# Patient Record
Sex: Female | Born: 1985 | Hispanic: Yes | Marital: Single | Smoking: Never smoker
Health system: Southern US, Community
[De-identification: ages and names within clinical notes are randomized; demographics above are authoritative.]

---

## 2015-07-20 LAB — OB RESULTS CONSOLE HIV ANTIBODY (ROUTINE TESTING): HIV: NONREACTIVE

## 2015-07-20 LAB — OB RESULTS CONSOLE RPR: RPR: NONREACTIVE

## 2015-07-20 LAB — OB RESULTS CONSOLE HEPATITIS B SURFACE ANTIGEN: HEP B S AG: NEGATIVE

## 2015-07-20 LAB — OB RESULTS CONSOLE VARICELLA ZOSTER ANTIBODY, IGG: VARICELLA IGG: IMMUNE

## 2015-07-20 LAB — OB RESULTS CONSOLE RUBELLA ANTIBODY, IGM: Rubella: IMMUNE

## 2015-08-14 NOTE — L&D Delivery Note (Signed)
Delivery Note  Pt admitted in active labor. She progressed spontaneously without anesthesia to 8cm. She requested an epidural and had a good response to it. AROM for thick mec fluid 2 hours prior to delivery. At 10:21 AM a viable and healthy female (Maybe Orpah Greekaron, maybe Jose- she is still deciding) was delivered via Vaginal, Spontaneous Delivery (Presentation: ; Occiput Anterior).  APGAR: 8, 9; weight 9 lb (4082 g).   Placenta status: spontaneous, intact.  Cord: 3 vessels with the following complications: None.    Anesthesia: Epidural  Episiotomy:  none Lacerations:  1st deg Suture Repair: 2.0 vicryl rapide Est. Blood Loss (mL):  300  Mom to postpartum.  Baby to Couplet care / Skin to Skin. Plans to breastfeed and is undecided on contraception.  Christeen DouglasBEASLEY, Anhelica Fowers 01/22/2016, 10:44 AM

## 2015-12-28 LAB — OB RESULTS CONSOLE GBS: GBS: NEGATIVE

## 2016-01-21 ENCOUNTER — Inpatient Hospital Stay
Admission: EM | Admit: 2016-01-21 | Discharge: 2016-01-24 | DRG: 775 | Disposition: A | Discharge status: Home / Self Care | Payer: Medicaid Other | Attending: Obstetrics and Gynecology | Admitting: Obstetrics and Gynecology

## 2016-01-21 ENCOUNTER — Encounter: Payer: Self-pay | Admitting: Obstetrics and Gynecology

## 2016-01-21 DIAGNOSIS — O1494 Unspecified pre-eclampsia, complicating childbirth: Secondary | ICD-10-CM | POA: Diagnosis present

## 2016-01-21 DIAGNOSIS — Z3A39 39 weeks gestation of pregnancy: Secondary | ICD-10-CM | POA: Diagnosis not present

## 2016-01-21 DIAGNOSIS — Z3493 Encounter for supervision of normal pregnancy, unspecified, third trimester: Secondary | ICD-10-CM

## 2016-01-21 LAB — RAPID HIV SCREEN (HIV 1/2 AB+AG)
HIV 1/2 ANTIBODIES: NONREACTIVE
HIV-1 P24 ANTIGEN - HIV24: NONREACTIVE

## 2016-01-21 LAB — CBC
HEMATOCRIT: 37.7 % (ref 35.0–47.0)
HEMOGLOBIN: 12.8 g/dL (ref 12.0–16.0)
MCH: 26 pg (ref 26.0–34.0)
MCHC: 33.9 g/dL (ref 32.0–36.0)
MCV: 76.7 fL — AB (ref 80.0–100.0)
Platelets: 301 10*3/uL (ref 150–440)
RBC: 4.91 MIL/uL (ref 3.80–5.20)
RDW: 14.2 % (ref 11.5–14.5)
WBC: 8.5 10*3/uL (ref 3.6–11.0)

## 2016-01-21 MED ORDER — BUTORPHANOL TARTRATE 1 MG/ML IJ SOLN
1.0000 mg | INTRAMUSCULAR | Status: DC | PRN
  Administered 2016-01-22 (×3): 1 mg via INTRAVENOUS
  Filled 2016-01-21 (×3): qty 1

## 2016-01-21 MED ORDER — LIDOCAINE HCL (PF) 1 % IJ SOLN
30.0000 mL | INTRAMUSCULAR | Status: DC | PRN
Start: 2016-01-21 — End: 2016-01-22

## 2016-01-21 MED ORDER — OXYCODONE-ACETAMINOPHEN 5-325 MG PO TABS: 2.0000 | ORAL_TABLET | ORAL | Status: DC | PRN

## 2016-01-21 MED ORDER — DEXTROSE 5 % IV SOLN
5.0000 10*6.[IU] | Freq: Once | INTRAVENOUS | Status: DC
  Filled 2016-01-21: qty 5

## 2016-01-21 MED ORDER — LACTATED RINGERS IV SOLN: 500.0000 mL | INTRAVENOUS | Status: DC | PRN

## 2016-01-21 MED ORDER — ACETAMINOPHEN 325 MG PO TABS: 650.0000 mg | ORAL_TABLET | ORAL | Status: DC | PRN

## 2016-01-21 MED ORDER — OXYTOCIN BOLUS FROM INFUSION: 500.0000 mL | INTRAVENOUS | Status: DC

## 2016-01-21 MED ORDER — DEXTROSE 5 % IV SOLN
2.5000 10*6.[IU] | INTRAVENOUS | Status: DC
  Filled 2016-01-21 (×3): qty 2.5

## 2016-01-21 MED ORDER — OXYTOCIN 40 UNITS IN LACTATED RINGERS INFUSION - SIMPLE MED
2.5000 [IU]/h | INTRAVENOUS | Status: DC
  Filled 2016-01-21: qty 1000

## 2016-01-21 MED ORDER — OXYCODONE-ACETAMINOPHEN 5-325 MG PO TABS: 1.0000 | ORAL_TABLET | ORAL | Status: DC | PRN

## 2016-01-21 MED ORDER — ONDANSETRON HCL 4 MG/2ML IJ SOLN: 4.0000 mg | Freq: Four times a day (QID) | INTRAMUSCULAR | Status: DC | PRN

## 2016-01-21 MED ORDER — SOD CITRATE-CITRIC ACID 500-334 MG/5ML PO SOLN: 30.0000 mL | ORAL | Status: DC | PRN

## 2016-01-21 MED ORDER — LACTATED RINGERS IV SOLN
INTRAVENOUS | Status: DC
  Administered 2016-01-21 – 2016-01-22 (×2): via INTRAVENOUS

## 2016-01-21 NOTE — H&P (Signed)
Holly [name] is a 30 y.o. female G2P1001 at 39+3 by sure LMP of 04/20/15 presenting for contractions x18hrs. Good fetal movement, no LOF or VB.  [facility] gets [facility] at [facility]. We have minimal records on her, but she states that her pregnancy has been uncomplicated. She is O pos. Prior [place]VD in [place] at term of 9#3oz baby, unmedicated. She is hoping for no epidural with this delivery as well. She is Spanish speaking only.  Maternal Medical History:  Reason for admission: Nausea.    OB History    Gravida Para Term Preterm AB TAB SAB Ectopic Multiple Living   2 1 1  0 0 0 0 0 0 1     No past medical history on file. No past surgical history on file. Family History: family history is not on file. Social History:  has no tobacco, alcohol, and drug history on file.   Prenatal Transfer Tool  Maternal Diabetes: No  Maternal Substance Abuse:  No Significant Maternal Medications:  None Significant Maternal Lab Results:  None Other Comments:  None  Review of Systems  Constitutional: Negative for fever and chills.  Eyes: Negative for blurred vision and double vision.  Respiratory: Negative for shortness of breath.   Cardiovascular: Negative for chest pain and palpitations.  Gastrointestinal: Negative for nausea, vomiting, abdominal pain, diarrhea and constipation.  Genitourinary: Negative for dysuria, urgency, frequency and flank pain.  Neurological: Negative for headaches.  Psychiatric/Behavioral: Negative for depression.    Dilation: 6 Effacement (%): 80 Station: -1 Exam by:: Dalbert GarnetBeasley, MD Blood pressure 136/94, pulse 93, temperature 98.4 F (36.9 C), temperature source Oral, resp. rate 18, height 5' 7.32" (1.71 m), weight 207 lb (93.895 kg). Maternal Exam:  Uterine Assessment: Contraction strength is firm.  Contraction frequency is regular.   Abdomen: Patient reports no abdominal tenderness. Fetal presentation: vertex  Introitus: Normal vulva. Normal vagina.   Pelvis: adequate for delivery.   Cervix: Cervix evaluated by digital exam.     Fetal Exam Fetal Monitor Review: Mode: ultrasound.    Fetal State Assessment: Category I - tracings are normal.     Physical Exam  Constitutional: She is oriented to person, place, and time. She appears well-developed and well-nourished. No distress.  Eyes: No scleral icterus.  Neck: Normal range of motion. Neck supple.  Cardiovascular: Normal rate.   Respiratory: Effort normal. No respiratory distress.  GI: Soft. She exhibits no distension. There is no tenderness.  Genitourinary: Vagina normal and uterus normal.  Musculoskeletal: Normal range of motion.  Neurological: She is alert and oriented to person, place, and time.  Skin: Skin is warm and dry.  Psychiatric: She has a normal mood and affect.    Prenatal labs: ABO, Rh: --/--/PENDING (06/10 2155) Antibody: PENDING (06/10 2155) Rubella:   immune RPR:   non reactive HBsAg:   negative HIV:   negative GBS:   negative  ASSESSMENT AND PLAN:  Admit for active labor Labs pending Epidural when desired Continuous fetal monitoring   1. Fetal Well being  - Fetal Tracing: Cat I - Ultrasound: confirmed vertex at bedside. Prior pregnancy Level II not available in paper or electronic records. Clinic not open tonight - Group B Streptococcus: neg - Presentation: vtx confirmed by u/s   2. Routine OB: - Prenatal labs reviewed, as above - Rh + (Opos)  3. Induction of Labor:  -  Contractions external toco in place -  Pelvis proven to 9#3oz  Anticipate vaginal delivery.    Christeen DouglasBEASLEY, Regnia Mathwig  01/21/2016, 11:26 PM

## 2016-01-22 ENCOUNTER — Inpatient Hospital Stay: Payer: Medicaid Other | Admitting: Anesthesiology

## 2016-01-22 ENCOUNTER — Encounter: Payer: Self-pay | Admitting: *Deleted

## 2016-01-22 LAB — TYPE AND SCREEN
ABO/RH(D): O POS
Antibody Screen: NEGATIVE

## 2016-01-22 MED ORDER — KETOROLAC TROMETHAMINE 30 MG/ML IJ SOLN: 30.0000 mg | Freq: Four times a day (QID) | INTRAMUSCULAR | Status: AC | PRN

## 2016-01-22 MED ORDER — TETANUS-DIPHTH-ACELL PERTUSSIS 5-2.5-18.5 LF-MCG/0.5 IM SUSP: 0.5000 mL | Freq: Once | INTRAMUSCULAR | Status: DC

## 2016-01-22 MED ORDER — LIDOCAINE HCL (PF) 1 % IJ SOLN
INTRAMUSCULAR | Status: DC | PRN
  Administered 2016-01-22: 3 mL via SUBCUTANEOUS

## 2016-01-22 MED ORDER — SIMETHICONE 80 MG PO CHEW: 80.0000 mg | CHEWABLE_TABLET | ORAL | Status: DC | PRN

## 2016-01-22 MED ORDER — BENZOCAINE-MENTHOL 20-0.5 % EX AERO
1.0000 "application " | INHALATION_SPRAY | CUTANEOUS | Status: DC | PRN
  Filled 2016-01-22: qty 56

## 2016-01-22 MED ORDER — ACETAMINOPHEN 325 MG PO TABS: 650.0000 mg | ORAL_TABLET | ORAL | Status: DC | PRN

## 2016-01-22 MED ORDER — SODIUM CHLORIDE 0.9 % IV SOLN
INTRAVENOUS | Status: DC | PRN
  Administered 2016-01-22 (×2): 5 mL via EPIDURAL

## 2016-01-22 MED ORDER — DIPHENHYDRAMINE HCL 25 MG PO CAPS: 25.0000 mg | ORAL_CAPSULE | ORAL | Status: DC | PRN

## 2016-01-22 MED ORDER — MISOPROSTOL 200 MCG PO TABS
ORAL_TABLET | ORAL | Status: AC
  Filled 2016-01-22: qty 4

## 2016-01-22 MED ORDER — LIDOCAINE-EPINEPHRINE (PF) 1.5 %-1:200000 IJ SOLN
INTRAMUSCULAR | Status: DC | PRN
  Administered 2016-01-22: 3 mL via PERINEURAL

## 2016-01-22 MED ORDER — IBUPROFEN 600 MG PO TABS
600.0000 mg | ORAL_TABLET | Freq: Four times a day (QID) | ORAL | Status: DC
  Administered 2016-01-22 – 2016-01-24 (×7): 600 mg via ORAL
  Filled 2016-01-22 (×7): qty 1

## 2016-01-22 MED ORDER — NALOXONE HCL 0.4 MG/ML IJ SOLN: 0.4000 mg | INTRAMUSCULAR | Status: DC | PRN

## 2016-01-22 MED ORDER — DIPHENHYDRAMINE HCL 25 MG PO CAPS: 25.0000 mg | ORAL_CAPSULE | Freq: Four times a day (QID) | ORAL | Status: DC | PRN

## 2016-01-22 MED ORDER — NALBUPHINE HCL 10 MG/ML IJ SOLN: 5.0000 mg | INTRAMUSCULAR | Status: DC | PRN

## 2016-01-22 MED ORDER — WITCH HAZEL-GLYCERIN EX PADS: 1.0000 "application " | MEDICATED_PAD | CUTANEOUS | Status: DC | PRN

## 2016-01-22 MED ORDER — ZOLPIDEM TARTRATE 5 MG PO TABS: 5.0000 mg | ORAL_TABLET | Freq: Every evening | ORAL | Status: DC | PRN

## 2016-01-22 MED ORDER — ONDANSETRON HCL 4 MG PO TABS: 4.0000 mg | ORAL_TABLET | ORAL | Status: DC | PRN

## 2016-01-22 MED ORDER — MEPERIDINE HCL 25 MG/ML IJ SOLN: 6.2500 mg | INTRAMUSCULAR | Status: DC | PRN

## 2016-01-22 MED ORDER — SODIUM CHLORIDE 0.9% FLUSH: 3.0000 mL | INTRAVENOUS | Status: DC | PRN

## 2016-01-22 MED ORDER — DIPHENHYDRAMINE HCL 50 MG/ML IJ SOLN: 12.5000 mg | INTRAMUSCULAR | Status: DC | PRN

## 2016-01-22 MED ORDER — SODIUM CHLORIDE 0.9% FLUSH: 3.0000 mL | Freq: Two times a day (BID) | INTRAVENOUS | Status: DC

## 2016-01-22 MED ORDER — OXYTOCIN 10 UNIT/ML IJ SOLN
INTRAMUSCULAR | Status: AC
  Filled 2016-01-22: qty 2

## 2016-01-22 MED ORDER — NALBUPHINE HCL 10 MG/ML IJ SOLN: 5.0000 mg | Freq: Once | INTRAMUSCULAR | Status: DC | PRN

## 2016-01-22 MED ORDER — NALOXONE HCL 2 MG/2ML IJ SOSY
1.0000 ug/kg/h | PREFILLED_SYRINGE | INTRAMUSCULAR | Status: DC | PRN
Start: 2016-01-22 — End: 2016-01-24
  Filled 2016-01-22: qty 2

## 2016-01-22 MED ORDER — MEASLES, MUMPS & RUBELLA VAC ~~LOC~~ INJ
0.5000 mL | INJECTION | Freq: Once | SUBCUTANEOUS | Status: DC
  Filled 2016-01-22: qty 0.5

## 2016-01-22 MED ORDER — SENNOSIDES-DOCUSATE SODIUM 8.6-50 MG PO TABS
2.0000 | ORAL_TABLET | ORAL | Status: DC
  Administered 2016-01-22: 2 via ORAL
  Filled 2016-01-22: qty 2

## 2016-01-22 MED ORDER — DIBUCAINE 1 % RE OINT: 1.0000 "application " | TOPICAL_OINTMENT | RECTAL | Status: DC | PRN

## 2016-01-22 MED ORDER — FENTANYL 2.5 MCG/ML W/ROPIVACAINE 0.2% IN NS 100 ML EPIDURAL INFUSION (ARMC-ANES)
EPIDURAL | Status: AC
  Administered 2016-01-22: 10 mL/h via EPIDURAL
  Filled 2016-01-22: qty 100

## 2016-01-22 MED ORDER — ONDANSETRON HCL 4 MG/2ML IJ SOLN: 4.0000 mg | Freq: Three times a day (TID) | INTRAMUSCULAR | Status: DC | PRN

## 2016-01-22 MED ORDER — FLEET ENEMA 7-19 GM/118ML RE ENEM: 1.0000 | ENEMA | Freq: Every day | RECTAL | Status: DC | PRN

## 2016-01-22 MED ORDER — FENTANYL 2.5 MCG/ML W/ROPIVACAINE 0.2% IN NS 100 ML EPIDURAL INFUSION (ARMC-ANES): 10.0000 mL/h | EPIDURAL | Status: DC

## 2016-01-22 MED ORDER — BISACODYL 10 MG RE SUPP
10.0000 mg | Freq: Every day | RECTAL | Status: DC | PRN
  Filled 2016-01-22: qty 1

## 2016-01-22 MED ORDER — AMMONIA AROMATIC IN INHA
RESPIRATORY_TRACT | Status: AC
  Filled 2016-01-22: qty 10

## 2016-01-22 MED ORDER — PRENATAL MULTIVITAMIN CH
1.0000 | ORAL_TABLET | Freq: Every day | ORAL | Status: DC
  Administered 2016-01-22 – 2016-01-24 (×3): 1 via ORAL
  Filled 2016-01-22 (×3): qty 1

## 2016-01-22 MED ORDER — LIDOCAINE HCL (PF) 1 % IJ SOLN
INTRAMUSCULAR | Status: AC
  Filled 2016-01-22: qty 30

## 2016-01-22 MED ORDER — COCONUT OIL OIL: 1.0000 "application " | TOPICAL_OIL | Status: DC | PRN

## 2016-01-22 MED ORDER — ONDANSETRON HCL 4 MG/2ML IJ SOLN: 4.0000 mg | INTRAMUSCULAR | Status: DC | PRN

## 2016-01-22 MED ORDER — SODIUM CHLORIDE 0.9 % IV SOLN: 250.0000 mL | INTRAVENOUS | Status: DC | PRN

## 2016-01-22 MED ORDER — OXYTOCIN 40 UNITS IN LACTATED RINGERS INFUSION - SIMPLE MED
1.0000 m[IU]/min | INTRAVENOUS | Status: DC
  Administered 2016-01-22: 4 m[IU]/min via INTRAVENOUS
  Administered 2016-01-22: 2 m[IU]/min via INTRAVENOUS
  Administered 2016-01-22: 1 m[IU]/min via INTRAVENOUS
  Administered 2016-01-22: 3 m[IU]/min via INTRAVENOUS

## 2016-01-22 NOTE — Progress Notes (Signed)
Holly [name] is a 30 y.o. G2P1001 at 4745w4d by LMP admitted for active labor  Subjective: Comfortable after epidural  Objective: BP 100/60 mmHg  Pulse 98  Temp(Src) 98.2 F (36.8 C) (Oral)  Resp 18  Ht 5' 7.32" (1.71 m)  Wt 207 lb (93.895 kg)  BMI 32.11 kg/m2      FHT:  Cat I with accels, mod var, no decels UC:   regular, every 4-5 minutes SVE:   Dilation: 8 Effacement (%): 90 Station: 0 Exam by:: BB MD  Labs: Lab Results  Component Value Date   WBC 8.5 01/21/2016   HGB 12.8 01/21/2016   HCT 37.7 01/21/2016   MCV 76.7* 01/21/2016   PLT 301 01/21/2016    Assessment / Plan: Spontaneous labor, progressing normally - AROM for thick meconium fluid  Labor: Progressing normally Preeclampsia:  Elevated BP now resolved after epidural Fetal Wellbeing:  Category I Pain Control:  Epidural I/D:  n/a Anticipated MOD:  NSVD  Holly [name] 01/22/2016, 7:58 AM

## 2016-01-22 NOTE — Progress Notes (Signed)
At 2020 RN to the bedside to triage patient...r/o labor.  Pt. Is spanish speaking, with FOB and family member at the bedside who is interpretering  For her. Video Interpreter to be used.  Pt. SVE = 80%/5/-1 with membrane intact. Video Interpreter now at the bedside... Used to completed admission assessment. Pt. To be admitted after notifying doctor.  Pt. Desires natural childbirth, no epidural; but will accept IV pain medication. RN will continue to monitor.

## 2016-01-22 NOTE — Plan of Care (Signed)
Pt up to bedside commode to void. Very small amount at this time. Pericare completed well per pt. New gown. [facility]. Ellison Carwin Carra Brindley RNC

## 2016-01-22 NOTE — Anesthesia Preprocedure Evaluation (Signed)
Anesthesia Evaluation  Patient identified by MRN, date of birth, ID band Patient awake    Reviewed: Allergy & Precautions, H&P , NPO status , Patient's Chart, lab work & pertinent test results, reviewed documented beta blocker date and time   History of Anesthesia Complications Negative for: history of anesthetic complications  Airway Mallampati: II  TM Distance: >3 FB Neck ROM: full    Dental no notable dental hx. (+) Teeth Intact, Caps   Pulmonary neg pulmonary ROS,    Pulmonary exam normal breath sounds clear to auscultation       Cardiovascular Exercise Tolerance: Good negative cardio ROS Normal cardiovascular exam Rhythm:regular Rate:Normal     Neuro/Psych negative neurological ROS  negative psych ROS   GI/Hepatic Neg liver ROS, GERD  ,  Endo/Other  negative endocrine ROS  Renal/GU negative Renal ROS  negative genitourinary   Musculoskeletal   Abdominal   Peds  Hematology negative hematology ROS (+)   Anesthesia Other Findings History reviewed. No pertinent past medical history.   Reproductive/Obstetrics negative OB ROS                             Anesthesia Physical Anesthesia Plan  ASA: II  Anesthesia Plan: Epidural   Post-op Pain Management:    Induction:   Airway Management Planned:   Additional Equipment:   Intra-op Plan:   Post-operative Plan:   Informed Consent: I have reviewed the patients History and Physical, chart, labs and discussed the procedure including the risks, benefits and alternatives for the proposed anesthesia with the patient or authorized representative who has indicated his/her understanding and acceptance.   Dental Advisory Given  Plan Discussed with: Anesthesiologist, CRNA and Surgeon  Anesthesia Plan Comments:         Anesthesia Quick Evaluation

## 2016-01-22 NOTE — Progress Notes (Signed)
Holly [name] is a 30 y.o. G2P1001 at 2017w4d by LMP admitted for active labor. Spanish interpreter by iPad present for discussion of epidural  Subjective: Feeling contractions strongly and requesting epidural for pain control  Objective: BP 140/93 mmHg  Pulse 85  Temp(Src) 98.2 F (36.8 C) (Oral)  Resp 18  Ht 5' 7.32" (1.71 m)  Wt 207 lb (93.895 kg)  BMI 32.11 kg/m2      FHT:  FHR: 130 bpm, variability: moderate,  accelerations:  Present,  decelerations:  Absent UC:   regular, every 4 minutes SVE:   Dilation: 8 Effacement (%): 90 Station: -1 Exam by:: Dalbert GarnetBeasley, MD  Labs: Lab Results  Component Value Date   WBC 8.5 01/21/2016   HGB 12.8 01/21/2016   HCT 37.7 01/21/2016   MCV 76.7* 01/21/2016   PLT 301 01/21/2016    Assessment / Plan: Spontaneous labor, progressing normally  Labor: Progressing normally. Pitocin was started and reached 592mu/min to increase frequency of contractions, but turned off as patient progressing. Fetal Wellbeing:  Category I Pain Control:  Labor support without medications and IV pain meds I/D:  n/a Anticipated MOD:  NSVD  Prabhleen Montemayor 01/22/2016, 6:21 AM

## 2016-01-22 NOTE — Progress Notes (Signed)
Dr. Dalbert GarnetBeasley at the bedside to exam patient and possible AROM.  Pt. In pain, wanting pain relief.  Video Interpreter being used, pt. Desires epidural; consent signed, Dr. Karlton LemonKarenz notified. Video interpreter remains on the the line (VI) Dr. Karlton LemonKarenz arrives.

## 2016-01-22 NOTE — Progress Notes (Signed)
Rafael used for interpretation of infant teaching and also meds and moms postpartum care, and to call in patients food. Patient voiced understanding and didn't have any questions

## 2016-01-22 NOTE — Anesthesia Procedure Notes (Signed)
Epidural Patient location during procedure: OB Start time: 01/22/2016 6:58 AM End time: 01/22/2016 7:11 AM  Staffing Anesthesiologist: Lenard SimmerKARENZ, Evanthia Maund Performed by: anesthesiologist   Preanesthetic Checklist Completed: patient identified, site marked, surgical consent, pre-op evaluation, timeout performed, IV checked, risks and benefits discussed and monitors and equipment checked  Epidural Patient position: sitting Prep: ChloraPrep Patient monitoring: heart rate, continuous pulse ox and blood pressure Approach: midline Location: L3-L4 Injection technique: LOR saline  Needle:  Needle type: Tuohy  Needle gauge: 17 G Needle length: 9 cm and 9 Needle insertion depth: 5.5 cm Catheter type: closed end flexible Catheter size: 19 Gauge Catheter at skin depth: 9.5 cm Test dose: negative and 1.5% lidocaine with Epi 1:200 K  Assessment Events: blood not aspirated, injection not painful, no injection resistance, negative IV test and no paresthesia  Additional Notes   Patient tolerated the insertion well without complications.Reason for block:procedure for pain

## 2016-01-22 NOTE — Progress Notes (Signed)
Patient ID: Holly [name], female   DOB: 07/18/1986, 30 y.o.   MRN: [id number]  Elevated BP x2 while in labor. No s/s PreE. Labs wnl. No HA. Likely pain related but will recheck when comfortable.

## 2016-01-23 LAB — CBC
HCT: 33.9 % — ABNORMAL LOW (ref 35.0–47.0)
HEMOGLOBIN: 11.6 g/dL — AB (ref 12.0–16.0)
MCH: 26.4 pg (ref 26.0–34.0)
MCHC: 34.3 g/dL (ref 32.0–36.0)
MCV: 77 fL — ABNORMAL LOW (ref 80.0–100.0)
PLATELETS: 241 10*3/uL (ref 150–440)
RBC: 4.4 MIL/uL (ref 3.80–5.20)
RDW: 14.4 % (ref 11.5–14.5)
WBC: 9.6 10*3/uL (ref 3.6–11.0)

## 2016-01-23 LAB — RPR: RPR: NONREACTIVE

## 2016-01-23 MED ORDER — IBUPROFEN 600 MG PO TABS: 600.0000 mg | ORAL_TABLET | Freq: Four times a day (QID) | ORAL | Status: DC

## 2016-01-23 NOTE — Discharge Summary (Signed)
Obstetric Discharge Summary Reason for Admission: onset of labor Prenatal Procedures: ultrasound Intrapartum Procedures: spontaneous vaginal delivery Postpartum Procedures: none Complications-Operative and Postpartum: 1 degree perineal laceration HEMOGLOBIN  Date Value Ref Range Status  01/23/2016 11.6* 12.0 - 16.0 g/dL Final   HCT  Date Value Ref Range Status  01/23/2016 33.9* 35.0 - 47.0 % Final    Physical Exam:  General: alert, cooperative and no distress CB: RRR Pulm: CTAB Lochia: appropriate Uterine Fundus: firm Incision: healing well, no significant drainage, no dehiscence, no significant erythema DVT Evaluation: No evidence of DVT seen on physical exam. Negative Homan's sign.  Discharge Diagnoses: Term Pregnancy-delivered  Discharge Information: Date: 01/23/2016 Activity: pelvic rest Diet: routine Medications: Ibuprofen Condition: stable Instructions: refer to practice specific booklet Discharge to: home Follow-up Information    Follow up with Christeen DouglasBEASLEY, Larell Baney, MD In 6 weeks.   Specialty:  Obstetrics and Gynecology   Why:  For postpartum visit   Contact information:   [address] [place] [address] [phone]       Newborn Data: Live born female Elita Quick(Jose or Clifton Custardaron) Birth Weight: 9 lb (4082 g) APGAR: 8, 9  Home with mother. Pt discharged home on postpartum day #1  Christeen DouglasBEASLEY, Therisa Mennella 01/23/2016, 11:31 AM

## 2016-01-23 NOTE — Anesthesia Postprocedure Evaluation (Signed)
Anesthesia Post Note  Patient: Holly RubensMirna Martinez [name]  Procedure(s) Performed: * No procedures listed *  Patient location during evaluation: Mother Baby Anesthesia Type: Epidural Level of consciousness: awake and alert and oriented Pain management: satisfactory to patient Vital Signs Assessment: post-procedure vital signs reviewed and stable Respiratory status: respiratory function stable Cardiovascular status: stable Postop Assessment: no headache, no backache, epidural receding, patient able to bend at knees, no signs of nausea or vomiting and adequate PO intake Anesthetic complications: no    Last Vitals:  Filed Vitals:   01/22/16 2300 01/23/16 0300  BP: 127/69 101/61  Pulse: 91 80  Temp: 37 C 36.6 C  Resp: 20 20    Last Pain:  Filed Vitals:   01/23/16 0611  PainSc: [name]                 Mikai Meints D

## 2016-01-23 NOTE — Discharge Instructions (Signed)
Parto vaginal, Cuidados posteriores  °(Vaginal Delivery, Care After) °Siga estas instrucciones durante las próximas semanas. Estas indicaciones para el alta le proporcionan información general acerca de cómo deberá cuidarse después del parto. El médico también podrá darle instrucciones específicas. El tratamiento ha sido planificado según las prácticas médicas actuales, pero en algunos casos pueden ocurrir problemas. Comuníquese con el médico si tiene algún problema o tiene preguntas al volver a su casa.  °INSTRUCCIONES PARA EL CUIDADO EN EL HOGAR  °· Tome sólo medicamentos de venta libre o recetados, según las indicaciones del médico o del farmacéutico. °· No beba alcohol, especialmente si está amamantando o toma analgésicos. °· No mastique tabaco ni fume. °· No consuma drogas. °· Continúe con un adecuado cuidado perineal. El buen cuidado perineal incluye: °¨ Higienizarse de adelante hacia atrás. °¨ Mantener la zona perineal limpia. °· No use tampones ni duchas vaginales hasta que su médico la autorice. °· Dúchese, lávese el cabello y tome baños de inmersión según las indicaciones de su médico. °· Utilice un sostén que le ajuste bien y que brinde buen soporte a sus mamas. °· Consuma alimentos saludables. °· Beba suficiente líquido para mantener la orina clara o de color amarillo pálido. °· Consuma alimentos ricos en fibra como cereales y panes integrales, arroz, frijoles y frutas y verduras frescas todos los días. Estos alimentos pueden ayudarla a prevenir o aliviar el estreñimiento. °· Siga las recomendaciones de su médico relacionadas con la reanudación de actividades como subir escaleras, conducir automóviles, levantar objetos, hacer ejercicios o viajar. °· Hable con su médico acerca de reanudar la actividad sexual. Volver a la actividad sexual depende del riesgo de infección, la velocidad de la curación y la comodidad y su deseo de reanudarla. °· Trate de que alguien la ayude con las actividades del hogar y con  el recién nacido al menos durante un par de días después de salir del hospital. °· Descanse todo lo que pueda. Trate de descansar o tomar una siesta mientras el bebé está durmiendo. °· Aumente sus actividades gradualmente. °· Cumpla con todas las visitas de control programadas para después del parto. Es muy importante asistir a todas las citas programadas de seguimiento. En estas citas, su médico va a controlarla para asegurarse de que esté sanando física y emocionalmente. °SOLICITE ATENCIÓN MÉDICA SI:  °· Elimina coágulos grandes por la vagina. Guarde algunos coágulos para mostrarle al médico. °· Tiene una secreción con feo olor que proviene de la vagina. °· Tiene dificultad para orinar. °· Orina con frecuencia. °· Siente dolor al orinar. °· Nota un cambio en sus movimientos intestinales. °· Aumenta el enrojecimiento, el dolor o la hinchazón en la zona de la incisión vaginal (episiotomía) o el desgarro vaginal. °· Tiene pus que drena por la episiotomía o el desgarro vaginal. °· La episiotomía o el desgarro vaginal se abren. °· Sus mamas le duelen, están duras o enrojecidas. °· Sufre un dolor intenso de cabeza. °· Tiene visión borrosa o ve manchas. °· Se siente triste o deprimida. °· Tiene pensamientos acerca de lastimarse o dañar al recién nacido. °· Tiene preguntas acerca de su cuidado personal, el cuidado del recién nacido o acerca de los medicamentos. °· Se siente mareada o sufre un desmayo. °· Tiene una erupción. °· Tiene náuseas o vómitos. °· Usted amamantó al bebé y no ha tenido su período menstrual dentro de las 12 semanas después de dejar de amamantar. °· No amamanta al bebé y no tuvo su período menstrual en las últimas 12° semanas después del   parto. °· Tiene fiebre. °SOLICITE ATENCIÓN MÉDICA DE INMEDIATO SI:  °· Siente dolor persistente. °· Siente dolor en el pecho. °· Le falta el aire. °· Se desmaya. °· Siente dolor en la pierna. °· Siente dolor en el estómago. °· El sangrado vaginal satura dos o más  apósitos en 1 hora. °  °Esta información no tiene como fin reemplazar el consejo del médico. Asegúrese de hacerle al médico cualquier pregunta que tenga. °  °Document Released: 07/30/2005 Document Revised: 04/20/2015 °Elsevier Interactive Patient Education ©2016 [employer] Inc[employer] ° °

## 2016-01-23 NOTE — Anesthesia Post-op Follow-up Note (Signed)
  Anesthesia Pain Follow-up Note  Patient: Holly [name]  Day #: 1  Date of Follow-up: 01/23/2016 Time: 7:19 AM  Last Vitals:  Filed Vitals:   01/22/16 2300 01/23/16 0300  BP: 127/69 101/61  Pulse: 91 80  Temp: 37 C 36.6 C  Resp: 20 20    Level of Consciousness: alert  Pain: none   Side Effects:None  Catheter Site Exam: site not evaluated  Plan: [name]/C from anesthesia care  Clydene PughBeane, Holly [name]

## 2016-01-24 MED ORDER — IBUPROFEN 600 MG PO TABS: 600.0000 mg | ORAL_TABLET | Freq: Four times a day (QID) | ORAL | Status: DC

## 2016-01-24 NOTE — Progress Notes (Signed)
Patient discharged. Vital signs stable, bleeding within normal limits, uterus firm. Discharge instructions, prescriptions, and follow up appointment given to and reviewed with patient. Patient verbalized understanding, all questions answered. Patient will stay in hospital with infant who is now a pediatric patient.   Imagene ShellerMegan Nefi Musich, RN

## 2016-01-24 NOTE — Discharge Summary (Signed)
Obstetric Discharge Summary Reason for Admission: onset of labor Prenatal Procedures: none Intrapartum Procedures: spontaneous vaginal delivery Postpartum Procedures: none Complications-Operative and Postpartum: none HEMOGLOBIN  Date Value Ref Range Status  01/23/2016 11.6* 12.0 - 16.0 g/dL Final   HCT  Date Value Ref Range Status  01/23/2016 33.9* 35.0 - 47.0 % Final    Physical Exam:  General: alert and cooperative Lochia: appropriate Uterine Fundus: firm Incision: n/a DVT Evaluation: No evidence of DVT seen on physical exam.  Discharge Diagnoses: Term Pregnancy-delivered  Discharge Information: Date: 01/24/2016 Activity: pelvic rest Diet: routine Medications: Ibuprofen Condition: stable Instructions: refer to practice specific booklet Discharge to: home Follow-up Information    Follow up with Christeen DouglasBEASLEY, BETHANY, MD In 6 weeks.   Specialty:  Obstetrics and Gynecology   Why:  For postpartum visit   Contact information:   [address] [place] [address] [phone]       Follow up with [facility] In 6 weeks.   Specialty:  General Practice   Why:  postpartum care   Contact information:   [address] [place] [address] [phone]       Newborn Data: Live born female  Birth Weight: 9 lb (4082 g) APGAR: 8, 9  Home with mother.  Holly [name] 01/24/2016, 8:45 AM

## 2021-07-05 ENCOUNTER — Other Ambulatory Visit: Payer: Self-pay | Admitting: Physician Assistant

## 2021-07-05 DIAGNOSIS — Z3687 Encounter for antenatal screening for uncertain dates: Secondary | ICD-10-CM

## 2021-07-12 ENCOUNTER — Ambulatory Visit
Admission: RE | Admit: 2021-07-12 | Discharge: 2021-07-12 | Disposition: A | Discharge status: Home / Self Care | Payer: BC Managed Care – PPO | Source: Ambulatory Visit | Attending: Physician Assistant | Admitting: Physician Assistant

## 2021-07-12 DIAGNOSIS — Z3687 Encounter for antenatal screening for uncertain dates: Secondary | ICD-10-CM | POA: Insufficient documentation

## 2021-07-31 LAB — OB RESULTS CONSOLE RUBELLA ANTIBODY, IGM: Rubella: IMMUNE

## 2021-07-31 LAB — OB RESULTS CONSOLE RPR: RPR: NONREACTIVE

## 2021-07-31 LAB — OB RESULTS CONSOLE HEPATITIS B SURFACE ANTIGEN: Hepatitis B Surface Ag: NEGATIVE

## 2021-07-31 LAB — OB RESULTS CONSOLE HIV ANTIBODY (ROUTINE TESTING): HIV: NONREACTIVE

## 2021-07-31 LAB — OB RESULTS CONSOLE VARICELLA ZOSTER ANTIBODY, IGG: Varicella: IMMUNE

## 2021-07-31 LAB — HEPATITIS C ANTIBODY: HCV Ab: NEGATIVE

## 2021-08-12 ENCOUNTER — Emergency Department: Payer: BC Managed Care – PPO

## 2021-08-12 ENCOUNTER — Emergency Department
Admission: EM | Admit: 2021-08-12 | Discharge: 2021-08-12 | Disposition: A | Discharge status: Home / Self Care | Payer: BC Managed Care – PPO | Attending: Emergency Medicine | Admitting: Emergency Medicine

## 2021-08-12 DIAGNOSIS — O209 Hemorrhage in early pregnancy, unspecified: Secondary | ICD-10-CM

## 2021-08-12 DIAGNOSIS — O4692 Antepartum hemorrhage, unspecified, second trimester: Secondary | ICD-10-CM | POA: Diagnosis not present

## 2021-08-12 DIAGNOSIS — Z3A13 13 weeks gestation of pregnancy: Secondary | ICD-10-CM | POA: Insufficient documentation

## 2021-08-12 LAB — CBC WITH DIFFERENTIAL/PLATELET
Abs Immature Granulocytes: 0.07 10*3/uL (ref 0.00–0.07)
Basophils Absolute: 0 10*3/uL (ref 0.0–0.1)
Basophils Relative: 0 %
Eosinophils Absolute: 0.1 10*3/uL (ref 0.0–0.5)
Eosinophils Relative: 1 %
HCT: 40.2 % (ref 36.0–46.0)
Hemoglobin: 14.1 g/dL (ref 12.0–15.0)
Immature Granulocytes: 1 %
Lymphocytes Relative: 26 %
Lymphs Abs: 2.5 10*3/uL (ref 0.7–4.0)
MCH: 28.8 pg (ref 26.0–34.0)
MCHC: 35.1 g/dL (ref 30.0–36.0)
MCV: 82 fL (ref 80.0–100.0)
Monocytes Absolute: 0.6 10*3/uL (ref 0.1–1.0)
Monocytes Relative: 6 %
Neutro Abs: 6.4 10*3/uL (ref 1.7–7.7)
Neutrophils Relative %: 66 %
Platelets: 310 10*3/uL (ref 150–400)
RBC: 4.9 MIL/uL (ref 3.87–5.11)
RDW: 13.2 % (ref 11.5–15.5)
WBC: 9.7 10*3/uL (ref 4.0–10.5)
nRBC: 0 % (ref 0.0–0.2)

## 2021-08-12 LAB — TYPE AND SCREEN
ABO/RH(D): O POS
Antibody Screen: NEGATIVE

## 2021-08-12 LAB — COMPREHENSIVE METABOLIC PANEL
ALT: 24 U/L (ref 0–44)
AST: 19 U/L (ref 15–41)
Albumin: 3.9 g/dL (ref 3.5–5.0)
Alkaline Phosphatase: 47 U/L (ref 38–126)
Anion gap: 9 (ref 5–15)
BUN: 8 mg/dL (ref 6–20)
CO2: 20 mmol/L — ABNORMAL LOW (ref 22–32)
Calcium: 9.5 mg/dL (ref 8.9–10.3)
Chloride: 106 mmol/L (ref 98–111)
Creatinine, Ser: 0.58 mg/dL (ref 0.44–1.00)
GFR, Estimated: >60 mL/min (ref >60)
Glucose, Bld: 103 mg/dL — ABNORMAL HIGH (ref 70–99)
Potassium: 3.6 mmol/L (ref 3.5–5.1)
Sodium: 135 mmol/L (ref 135–145)
Total Bilirubin: 0.5 mg/dL (ref 0.3–1.2)
Total Protein: 7.2 g/dL (ref 6.5–8.1)

## 2021-08-12 NOTE — Discharge Instructions (Addendum)
Call and make an appointment with Jeralyn Ruths No lifting more than 5 pounds.  No intercourse until see by your OB/GYN

## 2021-08-12 NOTE — ED Notes (Signed)
Pt denies increased bleeding and denies increased back and abdominal pain [redacted] wks pregnant

## 2021-08-12 NOTE — ED Triage Notes (Signed)
Pt states that she is pregnant and that tonight she had vaginal bleeding since 2300 and is still bleeding. Pt states that there are clots and that it is a lot of blood. Pt states she is [redacted]wks pregnant.  Pt also complains of left lower back pain.  Pt has been seen by an OB/GYN

## 2021-08-12 NOTE — ED Provider Notes (Signed)
[facility] Emergency Department Provider Note  ____________________________________________   Event Date/Time   First MD Initiated Contact with Patient 08/12/21 0831     (approximate)  I have reviewed the triage vital signs and the nursing notes.   HISTORY  Chief Complaint Vaginal Bleeding Spanish interpreter via [employer]  HPI Holly [name] is a 35 y.o. female resents to the ED with complaint of vaginal bleeding that started approximately 11 PM yesterday.  Patient states that there appeared to be a lot of blood at that time however since being in the emergency department there is very little bleeding.  She reports that this happens almost every time she has intercourse and this is true of last evening.  Patient is getting her prenatal care currently at [facility] clinic.  She rates her discomfort as 7 out of 10.         History reviewed. No pertinent past medical history.  Patient Active Problem List   Diagnosis Date Noted   Supervision of normal pregnancy in third trimester 01/21/2016    History reviewed. No pertinent surgical history.  Prior to Admission medications   Medication Sig Start Date End Date Taking? Authorizing Provider  ibuprofen (ADVIL,MOTRIN) 600 MG tablet Take 1 tablet (600 mg total) by mouth every 6 (six) hours. 01/24/16   Schermerhorn, Ihor Austin, MD    Allergies Patient has no known allergies.  History reviewed. No pertinent family history.  Social History Social History   Tobacco Use   Smoking status: Never  Substance Use Topics   Alcohol use: No   Drug use: No    Review of Systems Constitutional: No fever/chills Eyes: No visual changes. ENT: No sore throat. Cardiovascular: Denies chest pain. Respiratory: Denies shortness of breath. Gastrointestinal: No abdominal pain.  No nausea, no vomiting.  No diarrhea.   Genitourinary: Negative for dysuria.  Positive for vaginal bleeding.  Positive  pregnancy. Musculoskeletal: Positive low back discomfort. Skin: Negative for rash. Neurological: Negative for headaches, focal weakness or numbness.  ____________________________________________   PHYSICAL EXAM:  VITAL SIGNS: ED Triage Vitals  Enc Vitals Group     BP 08/12/21 0053 111/67     Pulse Rate 08/12/21 0053 (!) 104     Resp 08/12/21 0053 18     Temp 08/12/21 0053 98.1 F (36.7 C)     Temp Source 08/12/21 0053 Oral     SpO2 08/12/21 0053 98 %     Weight --      Height --      Head Circumference --      Peak Flow --      Pain Score 08/12/21 0056 7     Pain Loc --      Pain Edu? --      Excl. in GC? --     Constitutional: Alert and oriented. Well appearing and in no acute distress. Eyes: Conjunctivae are normal.  Head: Atraumatic. Neck: No stridor.   Cardiovascular: Normal rate, regular rhythm. Grossly normal heart sounds.  Good peripheral circulation. Respiratory: Normal respiratory effort.  No retractions. Lungs CTAB. Gastrointestinal: Soft and nontender. No distention.  No CVA tenderness. Musculoskeletal: Moves upper and lower extremities with any difficulty normal gait was noted. Neurologic:  Normal speech and language. No gross focal neurologic deficits are appreciated. No gait instability. Skin:  Skin is warm, dry and intact. No rash noted. Psychiatric: Mood and affect are normal. Speech and behavior are normal.  ____________________________________________   LABS (all labs ordered are listed,  but only abnormal results are displayed)  Labs Reviewed  COMPREHENSIVE METABOLIC PANEL - Abnormal; Notable for the following components:      Result Value   CO2 20 (*)    Glucose, Bld 103 (*)    All other components within normal limits  CBC WITH DIFFERENTIAL/PLATELET  POC URINE PREG, ED  TYPE AND SCREEN   ____________________________________________ ___________________________________________  RADIOLOGY Beaulah Corin, personally viewed and  evaluated these images (plain radiographs) as part of my medical decision making, as well as reviewing the written report by the radiologist.    Official radiology report(s): US OB Comp Less 14 Wks  Result Date: 08/12/2021 CLINICAL DATA:  Pregnant, vaginal bleeding. EXAM: OBSTETRIC <14 WK ULTRASOUND TECHNIQUE: Transabdominal ultrasound was performed for evaluation of the gestation as well as the maternal uterus and adnexal regions. COMPARISON:  07/12/2021 FINDINGS: Intrauterine gestational sac: Present, single Yolk sac:  Not visualized Embryo:  Present, single Cardiac Activity: Present, regular Heart Rate: 166 bpm MSD: Appropriate given fetal size CRL:   7.0 mm   13 w 2 d [place] EDC: 02/17/2022 (based on prior examination) Subchorionic hemorrhage:  None visualized. Maternal uterus/adnexae: The uterus is anteverted. No intrauterine masses are seen. The cervix is not optimally visualized but is unremarkable. No free intraperitoneal fluid. The maternal ovaries are not visualized on this examination. IMPRESSION: Single living intrauterine gestation with appropriate interval growth since prior examination. No acute abnormality. Electronically Signed   By: Helyn Numbers M.D.   On: 08/12/2021 02:44    ____________________________________________   PROCEDURES  Procedure(s) performed (including Critical Care):  Procedures   ____________________________________________   INITIAL IMPRESSION / ASSESSMENT AND PLAN / ED COURSE  As part of my medical decision making, I reviewed the following data within the electronic MEDICAL RECORD NUMBER Notes from prior ED visits and [place] Controlled Substance Database  35 year old female presents to the ED with complaint of vaginal bleeding while being proximately [redacted] weeks pregnant.  Patient states that she has noticed that each time she has intercourse that she has vaginal bleeding.  She admits that last evening prior to her bleeding she also had intercourse.  Currently there  is minimal bleeding.  Patient was reassured that her ultrasound did show that she is 13 weeks 2 days and that the baby's heart rate was 166.  No subchorionic hemorrhage was not seen.  Patient was given precautions to abstain from intercourse until she is seen by her OB and also to avoid lifting, pushing or pulling more than 5 pounds.  Patient was given return precautions over the holiday weekend but encouraged to follow-up with her OB doctor as needed.   ____________________________________________   FINAL CLINICAL IMPRESSION(S) / ED DIAGNOSES  Final diagnoses:  Second trimester bleeding     ED Discharge Orders     None        Note:  This document was prepared using Dragon voice recognition software and may include unintentional dictation errors.    Tommi Rumps, PA-C 08/12/21 1004    Minna Antis, MD 08/12/21 [phone]

## 2021-08-13 NOTE — L&D Delivery Note (Signed)
Delivery Note  Date of delivery: 02/14/2022 Estimated Date of Delivery: 02/17/22 No LMP recorded. Patient is pregnant. EGA: [redacted]w[redacted]d  Delivery Note At 7:38 PM a viable female was delivered via Vaginal, Spontaneous. (Presentation: OA).  APGAR: 8, 9; weight pending.  Placenta status: Spontaneous, Intact.  Cord: 3 vessels with the following complications: Long.  Cord pH: n/a  First Stage: Labor onset: unknown Augmentation : pitocin and AROM Analgesia /Anesthesia intrapartum: Epidural AROM at [employer] presented to L&D with active labor. She was augmented with pitocin. Epidural placed for pain relief.   Second Stage: Complete dilation at 1938 Onset of pushing at 1938 FHR second stage Cat I Delivery at 1938 on 02/14/2022  She progressed to complete and had a spontaneous vaginal birth of a live female over an intact perineum. The fetal head was delivered in OA position with restitution to ROA. No nuchal cord. Anterior then posterior shoulders delivered spontaneously. Baby placed on mom's abdomen and attended to by transition RN. Cord clamped and cut when after a couple of minutes by the patient. Cord blood obtained for newborn labs.  Third Stage: Placenta delivered intact with 3VC at 1950 Placenta disposition: pathology for meconium Uterine tone firm / bleeding min IV pitocin given for hemorrhage prophylaxis  Anesthesia: Epidural Episiotomy: None Lacerations: None Suture Repair: n/a Est. Blood Loss (mL):  100  Complications: none  Mom to postpartum.  Baby to Couplet care / Skin to Skin.  Newborn: Birth Weight: pending  Apgar Scores: 8, 9 Feeding planned: Breast and bottle   Cyril Mourning, CNM 02/14/2022 8:02 PM

## 2022-01-18 ENCOUNTER — Other Ambulatory Visit: Payer: Self-pay | Admitting: Family Medicine

## 2022-01-18 DIAGNOSIS — Z3480 Encounter for supervision of other normal pregnancy, unspecified trimester: Secondary | ICD-10-CM

## 2022-01-26 LAB — OB RESULTS CONSOLE GBS: GBS: NEGATIVE

## 2022-01-26 LAB — OB RESULTS CONSOLE GC/CHLAMYDIA
Chlamydia: NEGATIVE
Neisseria Gonorrhea: NEGATIVE

## 2022-02-08 ENCOUNTER — Observation Stay
Admission: EM | Admit: 2022-02-08 | Discharge: 2022-02-08 | Disposition: A | Discharge status: Home / Self Care | Payer: BC Managed Care – PPO | Attending: Obstetrics and Gynecology | Admitting: Obstetrics and Gynecology

## 2022-02-08 DIAGNOSIS — O09523 Supervision of elderly multigravida, third trimester: Secondary | ICD-10-CM | POA: Insufficient documentation

## 2022-02-08 DIAGNOSIS — O358XX0 Maternal care for other (suspected) fetal abnormality and damage, not applicable or unspecified: Principal | ICD-10-CM | POA: Insufficient documentation

## 2022-02-08 DIAGNOSIS — O288 Other abnormal findings on antenatal screening of mother: Secondary | ICD-10-CM | POA: Diagnosis present

## 2022-02-08 DIAGNOSIS — Z3A38 38 weeks gestation of pregnancy: Secondary | ICD-10-CM | POA: Diagnosis not present

## 2022-02-08 NOTE — OB Triage Note (Signed)
Patient sent from [facility] for non reactive NST

## 2022-02-08 NOTE — Discharge Summary (Addendum)
Holly [name] is a 36 y.o. female. She is at [redacted]w[redacted]d gestation. No LMP recorded. Patient is pregnant. Estimated Date of Delivery: 02/17/22    Prenatal care site: [facility]    Chief Complaint: high risk pregnancy, need for antepartum surveillance  Holly [name] presents to L&D today for repeat NST d/t nonreactive NST in office.    S: Resting comfortably. no CTX, no VB.no LOF,  Active fetal movement.    Maternal Medical History:  Past Medical Hx:  has no past medical history on file.  Past Surgical Hx:  has no past surgical history on file.   No Known Allergies  Prior to Admission medications   Medication Sig Start Date End Date Taking? Authorizing Provider  ibuprofen (ADVIL,MOTRIN) 600 MG tablet Take 1 tablet (600 mg total) by mouth every 6 (six) hours. 01/24/16   Schermerhorn, Ihor Austin, MD     Social History: She  reports that she has never smoked. She does not have any smokeless tobacco history on file. She reports that she does not drink alcohol and does not use drugs.  Family History: family history non-contributory, no history of gyn cancers  Review of Systems: A full review of systems was performed and negative except as noted in the HPI.     O:  BP 129/75 (BP Location: Left Arm)   Pulse 87   Temp 98.6 F (37 C) (Oral)   Resp 20   Ht 5' 6.93" (1.7 m)   Wt (!) 136.5 kg   BMI 47.24 kg/m  No results found for this or any previous visit (from the past 48 hour(s)).   Constitutional: NAD, AAOx3  HE/ENT: extraocular movements grossly intact, moist mucous membranes CV: RRR PULM: nl respiratory effort, CTABL     Abd: gravid, non-tender, non-distended, soft      Ext: Non-tender, Nonedmeatous   Psych: mood appropriate, speech normal Pelvic: deferred   NST: Baseline: 155 Variability: moderate Accelerations present x >2 Decelerations absent Time   A/P: 36 y.o. [redacted]w[redacted]d with high risk pregnancy and antepartum surveillance.  Labor: not present.  Fetal  Wellbeing: Reassuring Cat 1 tracing. NST reviewed, reactive NST  D/c home stable, precautions reviewed, follow-up as scheduled.   ----- Margaretmary Eddy, CNM Certified Nurse Midwife [facility]

## 2022-02-14 ENCOUNTER — Other Ambulatory Visit: Payer: Self-pay

## 2022-02-14 ENCOUNTER — Encounter: Payer: Self-pay | Admitting: Obstetrics and Gynecology

## 2022-02-14 ENCOUNTER — Inpatient Hospital Stay
Admission: EM | Admit: 2022-02-14 | Discharge: 2022-02-16 | DRG: 797 | Disposition: A | Discharge status: Home / Self Care | Payer: BC Managed Care – PPO | Attending: Obstetrics | Admitting: Obstetrics

## 2022-02-14 ENCOUNTER — Inpatient Hospital Stay: Payer: BC Managed Care – PPO | Admitting: Anesthesiology

## 2022-02-14 DIAGNOSIS — Z3A39 39 weeks gestation of pregnancy: Secondary | ICD-10-CM

## 2022-02-14 DIAGNOSIS — O9081 Anemia of the puerperium: Secondary | ICD-10-CM | POA: Diagnosis not present

## 2022-02-14 DIAGNOSIS — Z302 Encounter for sterilization: Secondary | ICD-10-CM | POA: Diagnosis not present

## 2022-02-14 DIAGNOSIS — D62 Acute posthemorrhagic anemia: Secondary | ICD-10-CM | POA: Diagnosis not present

## 2022-02-14 DIAGNOSIS — O99214 Obesity complicating childbirth: Secondary | ICD-10-CM | POA: Diagnosis present

## 2022-02-14 DIAGNOSIS — O26893 Other specified pregnancy related conditions, third trimester: Secondary | ICD-10-CM | POA: Diagnosis present

## 2022-02-14 LAB — CBC
HCT: 37.9 % (ref 36.0–46.0)
Hemoglobin: 12.6 g/dL (ref 12.0–15.0)
MCH: 25.8 pg — ABNORMAL LOW (ref 26.0–34.0)
MCHC: 33.2 g/dL (ref 30.0–36.0)
MCV: 77.7 fL — ABNORMAL LOW (ref 80.0–100.0)
Platelets: 318 10*3/uL (ref 150–400)
RBC: 4.88 MIL/uL (ref 3.87–5.11)
RDW: 14.1 % (ref 11.5–15.5)
WBC: 8.5 10*3/uL (ref 4.0–10.5)
nRBC: 0 % (ref 0.0–0.2)

## 2022-02-14 LAB — TYPE AND SCREEN
ABO/RH(D): O POS
Antibody Screen: NEGATIVE

## 2022-02-14 LAB — COMPREHENSIVE METABOLIC PANEL
ALT: 20 U/L (ref 0–44)
AST: 23 U/L (ref 15–41)
Albumin: 2.8 g/dL — ABNORMAL LOW (ref 3.5–5.0)
Alkaline Phosphatase: 157 U/L — ABNORMAL HIGH (ref 38–126)
Anion gap: 9 (ref 5–15)
BUN: 7 mg/dL (ref 6–20)
CO2: 18 mmol/L — ABNORMAL LOW (ref 22–32)
Calcium: 8.4 mg/dL — ABNORMAL LOW (ref 8.9–10.3)
Chloride: 107 mmol/L (ref 98–111)
Creatinine, Ser: 0.47 mg/dL (ref 0.44–1.00)
GFR, Estimated: >60 mL/min (ref >60)
Glucose, Bld: 90 mg/dL (ref 70–99)
Potassium: 3.8 mmol/L (ref 3.5–5.1)
Sodium: 134 mmol/L — ABNORMAL LOW (ref 135–145)
Total Bilirubin: 0.5 mg/dL (ref 0.3–1.2)
Total Protein: 6.4 g/dL — ABNORMAL LOW (ref 6.5–8.1)

## 2022-02-14 LAB — PROTEIN / CREATININE RATIO, URINE
Creatinine, Urine: 153 mg/dL
Protein Creatinine Ratio: 0.41 mg/mg{Cre} — ABNORMAL HIGH (ref 0.00–0.15)
Total Protein, Urine: 62 mg/dL

## 2022-02-14 LAB — RAPID HIV SCREEN (HIV 1/2 AB+AG)
HIV 1/2 Antibodies: NONREACTIVE
HIV-1 P24 Antigen - HIV24: NONREACTIVE

## 2022-02-14 MED ORDER — EPHEDRINE 5 MG/ML INJ: 10.0000 mg | INTRAVENOUS | Status: DC | PRN

## 2022-02-14 MED ORDER — AMMONIA AROMATIC IN INHA
RESPIRATORY_TRACT | Status: AC
  Filled 2022-02-14: qty 10

## 2022-02-14 MED ORDER — FERROUS SULFATE 325 (65 FE) MG PO TABS
325.0000 mg | ORAL_TABLET | Freq: Two times a day (BID) | ORAL | Status: DC
  Administered 2022-02-15 – 2022-02-16 (×2): 325 mg via ORAL
  Filled 2022-02-14 (×2): qty 1

## 2022-02-14 MED ORDER — TERBUTALINE SULFATE 1 MG/ML IJ SOLN: 0.2500 mg | Freq: Once | INTRAMUSCULAR | Status: DC | PRN

## 2022-02-14 MED ORDER — COCONUT OIL OIL: 1.0000 | TOPICAL_OIL | Status: DC | PRN

## 2022-02-14 MED ORDER — BUPIVACAINE HCL (PF) 0.25 % IJ SOLN
INTRAMUSCULAR | Status: DC | PRN
  Administered 2022-02-14 (×2): 4 mL via EPIDURAL

## 2022-02-14 MED ORDER — OXYTOCIN-SODIUM CHLORIDE 30-0.9 UT/500ML-% IV SOLN
2.5000 [IU]/h | INTRAVENOUS | Status: DC
  Administered 2022-02-14: 2.5 [IU]/h via INTRAVENOUS
  Filled 2022-02-14: qty 500

## 2022-02-14 MED ORDER — FENTANYL-BUPIVACAINE-NACL 0.5-0.125-0.9 MG/250ML-% EP SOLN
12.0000 mL/h | EPIDURAL | Status: DC | PRN
  Administered 2022-02-14: 12 mL/h via EPIDURAL
  Filled 2022-02-14: qty 250

## 2022-02-14 MED ORDER — SODIUM CHLORIDE 0.9 % IV SOLN: 5.0000 10*6.[IU] | Freq: Once | INTRAVENOUS | Status: DC

## 2022-02-14 MED ORDER — LIDOCAINE HCL (PF) 1 % IJ SOLN
INTRAMUSCULAR | Status: AC
  Filled 2022-02-14: qty 30

## 2022-02-14 MED ORDER — PRENATAL MULTIVITAMIN CH
1.0000 | ORAL_TABLET | Freq: Every day | ORAL | Status: DC
  Administered 2022-02-16: 1 via ORAL
  Filled 2022-02-14: qty 1

## 2022-02-14 MED ORDER — LIDOCAINE HCL (PF) 1 % IJ SOLN: 30.0000 mL | INTRAMUSCULAR | Status: DC | PRN

## 2022-02-14 MED ORDER — LACTATED RINGERS IV SOLN: INTRAVENOUS | Status: DC

## 2022-02-14 MED ORDER — DIPHENHYDRAMINE HCL 25 MG PO CAPS: 25.0000 mg | ORAL_CAPSULE | Freq: Four times a day (QID) | ORAL | Status: DC | PRN

## 2022-02-14 MED ORDER — PENICILLIN G POT IN DEXTROSE 60000 UNIT/ML IV SOLN: 3.0000 10*6.[IU] | INTRAVENOUS | Status: DC

## 2022-02-14 MED ORDER — DIBUCAINE (PERIANAL) 1 % EX OINT
1.0000 | TOPICAL_OINTMENT | CUTANEOUS | Status: DC | PRN
  Administered 2022-02-14: 1 via RECTAL
  Filled 2022-02-14: qty 28

## 2022-02-14 MED ORDER — ONDANSETRON HCL 4 MG PO TABS: 4.0000 mg | ORAL_TABLET | ORAL | Status: DC | PRN

## 2022-02-14 MED ORDER — FENTANYL CITRATE (PF) 100 MCG/2ML IJ SOLN
50.0000 ug | INTRAMUSCULAR | Status: DC | PRN
  Administered 2022-02-14: 50 ug via INTRAVENOUS
  Filled 2022-02-14: qty 2

## 2022-02-14 MED ORDER — WITCH HAZEL-GLYCERIN EX PADS
MEDICATED_PAD | CUTANEOUS | Status: AC
  Administered 2022-02-14: 1 via TOPICAL
  Filled 2022-02-14: qty 100

## 2022-02-14 MED ORDER — OXYTOCIN BOLUS FROM INFUSION
333.0000 mL | Freq: Once | INTRAVENOUS | Status: AC
  Administered 2022-02-14: 333 mL via INTRAVENOUS

## 2022-02-14 MED ORDER — PHENYLEPHRINE 80 MCG/ML (10ML) SYRINGE FOR IV PUSH (FOR BLOOD PRESSURE SUPPORT): 80.0000 ug | PREFILLED_SYRINGE | INTRAVENOUS | Status: DC | PRN

## 2022-02-14 MED ORDER — ONDANSETRON HCL 4 MG/2ML IJ SOLN: 4.0000 mg | Freq: Four times a day (QID) | INTRAMUSCULAR | Status: DC | PRN

## 2022-02-14 MED ORDER — TETANUS-DIPHTH-ACELL PERTUSSIS 5-2.5-18.5 LF-MCG/0.5 IM SUSY
0.5000 mL | PREFILLED_SYRINGE | Freq: Once | INTRAMUSCULAR | Status: DC
  Filled 2022-02-14: qty 0.5

## 2022-02-14 MED ORDER — SIMETHICONE 80 MG PO CHEW: 80.0000 mg | CHEWABLE_TABLET | ORAL | Status: DC | PRN

## 2022-02-14 MED ORDER — MISOPROSTOL 200 MCG PO TABS
ORAL_TABLET | ORAL | Status: AC
  Filled 2022-02-14: qty 4

## 2022-02-14 MED ORDER — LACTATED RINGERS IV SOLN
500.0000 mL | Freq: Once | INTRAVENOUS | Status: AC
  Administered 2022-02-14: 500 mL via INTRAVENOUS

## 2022-02-14 MED ORDER — BENZOCAINE-MENTHOL 20-0.5 % EX AERO
1.0000 | INHALATION_SPRAY | CUTANEOUS | Status: DC | PRN
  Administered 2022-02-14: 1 via TOPICAL
  Filled 2022-02-14: qty 56

## 2022-02-14 MED ORDER — SOD CITRATE-CITRIC ACID 500-334 MG/5ML PO SOLN: 30.0000 mL | ORAL | Status: DC | PRN

## 2022-02-14 MED ORDER — IBUPROFEN 600 MG PO TABS
600.0000 mg | ORAL_TABLET | Freq: Four times a day (QID) | ORAL | Status: DC
  Administered 2022-02-14 – 2022-02-16 (×5): 600 mg via ORAL
  Filled 2022-02-14 (×6): qty 1

## 2022-02-14 MED ORDER — LIDOCAINE-EPINEPHRINE (PF) 1.5 %-1:200000 IJ SOLN
INTRAMUSCULAR | Status: DC | PRN
  Administered 2022-02-14: 3 mL via EPIDURAL

## 2022-02-14 MED ORDER — ACETAMINOPHEN 325 MG PO TABS
650.0000 mg | ORAL_TABLET | ORAL | Status: DC | PRN
  Administered 2022-02-14 – 2022-02-16 (×3): 650 mg via ORAL
  Filled 2022-02-14 (×3): qty 2

## 2022-02-14 MED ORDER — WITCH HAZEL-GLYCERIN EX PADS: 1.0000 | MEDICATED_PAD | CUTANEOUS | Status: DC | PRN

## 2022-02-14 MED ORDER — LACTATED RINGERS IV SOLN: 500.0000 mL | INTRAVENOUS | Status: DC | PRN

## 2022-02-14 MED ORDER — DOCUSATE SODIUM 100 MG PO CAPS
100.0000 mg | ORAL_CAPSULE | Freq: Two times a day (BID) | ORAL | Status: DC
  Administered 2022-02-15 – 2022-02-16 (×2): 100 mg via ORAL
  Filled 2022-02-14 (×2): qty 1

## 2022-02-14 MED ORDER — OXYCODONE HCL 5 MG PO TABS: 10.0000 mg | ORAL_TABLET | ORAL | Status: DC | PRN

## 2022-02-14 MED ORDER — ONDANSETRON HCL 4 MG/2ML IJ SOLN: 4.0000 mg | INTRAMUSCULAR | Status: DC | PRN

## 2022-02-14 MED ORDER — OXYTOCIN-SODIUM CHLORIDE 30-0.9 UT/500ML-% IV SOLN
1.0000 m[IU]/min | INTRAVENOUS | Status: DC
  Administered 2022-02-14: 2 m[IU]/min via INTRAVENOUS

## 2022-02-14 MED ORDER — OXYCODONE HCL 5 MG PO TABS: 5.0000 mg | ORAL_TABLET | ORAL | Status: DC | PRN

## 2022-02-14 MED ORDER — DIPHENHYDRAMINE HCL 50 MG/ML IJ SOLN: 12.5000 mg | INTRAMUSCULAR | Status: DC | PRN

## 2022-02-14 MED ORDER — ACETAMINOPHEN 500 MG PO TABS: 1000.0000 mg | ORAL_TABLET | Freq: Four times a day (QID) | ORAL | Status: DC | PRN

## 2022-02-14 MED ORDER — OXYTOCIN 10 UNIT/ML IJ SOLN
INTRAMUSCULAR | Status: AC
  Filled 2022-02-14: qty 2

## 2022-02-14 MED ORDER — LIDOCAINE HCL (PF) 1 % IJ SOLN
INTRAMUSCULAR | Status: DC | PRN
  Administered 2022-02-14: 3 mL via SUBCUTANEOUS

## 2022-02-14 NOTE — H&P (Signed)
OB History & Physical   History of Present Illness:  Chief Complaint:   HPI:  Holly [name] is a 36 y.o. G65P2002 female at [redacted]w[redacted]d dated by an 8 week u/s.  She presents to L&D for active labor.   She reports:  -active fetal movement -no leakage of fluid -bloody show present on admission -contractions currently every 2-4 minutes  Pregnancy Issues: 1. Elevated BPs started around 35 weeks 2. EFW 84% on 6/14   Maternal Medical History:  No past medical history on file.  No past surgical history on file.  No Known Allergies  Prior to Admission medications   Not on File     Prenatal care site: [facility]  Social History: She  reports that she has never smoked. She does not have any smokeless tobacco history on file. She reports that she does not drink alcohol and does not use drugs.  Family History: family history is not on file.   Review of Systems: A full review of systems was performed and negative except as noted in the HPI.    Physical Exam:  Vital Signs: BP (!) 142/73   Pulse 66   Temp 98.1 F (36.7 C) (Oral)   Ht 5\' 6"  (1.676 m)   Wt (!) 136.5 kg   BMI 48.58 kg/m   General:   alert and cooperative  Skin:  normal  Neurologic:    Alert & oriented x 3  Lungs:    Nl effort  Heart:   regular rate and rhythm  Abdomen:  soft, non-tender; bowel sounds normal; no masses,  no organomegaly  Extremities: : non-tender, symmetric, no edema bilaterally.      EFW: 3565g = 84%  Results for orders placed or performed during the hospital encounter of 02/14/22 (from the past 24 hour(s))  CBC     Status: Abnormal   Collection Time: 02/14/22  8:11 AM  Result Value Ref Range   WBC 8.5 4.0 - 10.5 K/uL   RBC 4.88 3.87 - 5.11 MIL/uL   Hemoglobin 12.6 12.0 - 15.0 g/dL   HCT 04/17/22 65.7 - 84.6 %   MCV 77.7 (L) 80.0 - 100.0 fL   MCH 25.8 (L) 26.0 - 34.0 pg   MCHC 33.2 30.0 - 36.0 g/dL   RDW 96.2 95.2 - 84.1 %   Platelets 318 150 - 400 K/uL   nRBC 0.0 0.0 - 0.2 %   Type and screen [facility]     Status: None (Preliminary result)   Collection Time: 02/14/22  8:11 AM  Result Value Ref Range   ABO/RH(D) PENDING    Antibody Screen PENDING    Sample Expiration      02/17/2022,2359 Performed at [facility], [address] [place], [place]     Pertinent Results:  Prenatal Labs: Blood type/Rh O pos  Antibody screen Neg  Rubella Immune  Varicella Immune  RPR NR  HBsAg NR  HIV NR  GC Neg  Chlamydia Neg  Genetic screening   1 hour GTT 136  3 hour GTT 85, 173, 113, 81  GBS Neg   FHT: FHR: 140 bpm, variability: moderate,  accelerations:  Present,  decelerations:  Absent Category/reactivity:  Category I TOCO: regular, every 2-4 minutes SVE: Dilation: 4.5 / Effacement (%): 80, 90 / Station: -3     Assessment:  Holly [name] is a 36 y.o. G51P2002 female at [redacted]w[redacted]d with active labor.   Plan:  1. Admit to Labor & Delivery;  consents reviewed and obtained  2. Fetal Well being  - Fetal Tracing: cat I - GBS neg - Presentation: vtx confirmed by sve   3. Routine OB: - Prenatal labs reviewed, as above - Rh pos - CBC & T&S on admit - Clear fluids, IVF  4. Monitoring of Labor -  Contractions by external toco in place -  Pelvis proven to 9lb3oz -  Plan for continuous fetal monitoring  -  Maternal pain control as desired: IVPM, nitrous, regional anesthesia - Anticipate vaginal delivery  5. Post Partum Planning: - Infant feeding: Breast - Contraception: BTL  Haroldine Laws, CNM 02/14/2022 9:18 AM

## 2022-02-14 NOTE — Anesthesia Procedure Notes (Addendum)
Epidural Patient location during procedure: OB Start time: 02/14/2022 9:37 AM End time: 02/14/2022 9:45 AM  Staffing Anesthesiologist: Lenard Simmer, MD Resident/CRNA: Irving Burton, CRNA Performed: anesthesiologist   Preanesthetic Checklist Completed: patient identified, IV checked, site marked, risks and benefits discussed, surgical consent, monitors and equipment checked, pre-op evaluation and timeout performed  Epidural Patient position: sitting Prep: ChloraPrep Patient monitoring: heart rate, continuous pulse ox and blood pressure Approach: midline Location: L3-L4 Injection technique: LOR air  Needle:  Needle type: Tuohy  Needle gauge: 17 G Needle length: 9 cm and 9 Needle insertion depth: 7 cm Catheter type: closed end flexible Catheter size: 19 Gauge Catheter at skin depth: 12 cm Test dose: negative and 1.5% lidocaine with Epi 1:200 K  Assessment Events: blood not aspirated, injection not painful, no injection resistance, no paresthesia and negative IV test  Additional Notes 1 attempt Pt. Evaluated and documentation done after procedure finished. Patient identified. Risks/Benefits/Options discussed with patient including but not limited to bleeding, infection, nerve damage, paralysis, failed block, incomplete pain control, headache, blood pressure changes, nausea, vomiting, reactions to medication both or allergic, itching and postpartum back pain. Confirmed with bedside nurse the patient's most recent platelet count. Confirmed with patient that they are not currently taking any anticoagulation, have any bleeding history or any family history of bleeding disorders. Patient expressed understanding and wished to proceed. All questions were answered. Sterile technique was used throughout the entire procedure. Please see nursing notes for vital signs. Test dose was given through epidural catheter and negative prior to continuing to dose epidural or start infusion. Warning  signs of high block given to the patient including shortness of breath, tingling/numbness in hands, complete motor block, or any concerning symptoms with instructions to call for help. Patient was given instructions on fall risk and not to get out of bed. All questions and concerns addressed with instructions to call with any issues or inadequate analgesia.    Patient tolerated the insertion well without immediate complications.Reason for block:procedure for pain

## 2022-02-14 NOTE — Anesthesia Preprocedure Evaluation (Signed)
Anesthesia Evaluation  Patient identified by MRN, date of birth, ID band Patient awake    Reviewed: Allergy & Precautions, H&P , NPO status , Patient's Chart, lab work & pertinent test results  Airway Mallampati: III  TM Distance: >3 FB Neck ROM: full    Dental  (+) Chipped   Pulmonary    Pulmonary exam normal        Cardiovascular      Neuro/Psych    GI/Hepatic GERD  ,  Endo/Other  Morbid obesity  Renal/GU      Musculoskeletal   Abdominal   Peds  Hematology negative hematology ROS (+)   Anesthesia Other Findings   Reproductive/Obstetrics (+) Pregnancy                             Anesthesia Physical Anesthesia Plan  ASA: 3  Anesthesia Plan: Epidural   Post-op Pain Management:    Induction:   PONV Risk Score and Plan:   Airway Management Planned:   Additional Equipment:   Intra-op Plan:   Post-operative Plan:   Informed Consent: I have reviewed the patients History and Physical, chart, labs and discussed the procedure including the risks, benefits and alternatives for the proposed anesthesia with the patient or authorized representative who has indicated his/her understanding and acceptance.       Plan Discussed with: Anesthesiologist and CRNA  Anesthesia Plan Comments:         Anesthesia Quick Evaluation

## 2022-02-14 NOTE — OB Triage Note (Signed)
Pt arrived to unit for contractions. Patient denies LOF or active vaginal bleeding. Pt placed on EFM and TOCO to non tender area of abdomen. Pt difficult to monitor due to obesity and BMI. Patient history reviewed. [employer] interpreter line used (See flowsheets) Will notify provider of patient's arrival.

## 2022-02-14 NOTE — Progress Notes (Signed)
Labor Progress Note  Holly [name] is a 36 y.o. G3P2002 at [redacted]w[redacted]d by ultrasound admitted for active labor  Subjective: Pt is now comfortable with epidural.  Objective: BP (!) 142/73   Pulse 66   Temp 98.1 F (36.7 C) (Oral)   Ht 5\' 6"  (1.676 m)   Wt (!) 136.5 kg   BMI 48.58 kg/m    Fetal Assessment: FHT:  FHR: 140 bpm, variability: moderate,  accelerations:  Present,  decelerations:  Absent Category/reactivity:  Category I UC:   regular, every 2-4 minutes SVE:    Dilation: 7cm  Effacement: 100%  Station:  -1  Consistency: soft  Position: anterior  Membrane status: AROM'd Amniotic color: Moderate MSAF  Labs: Lab Results  Component Value Date   WBC 8.5 02/14/2022   HGB 12.6 02/14/2022   HCT 37.9 02/14/2022   MCV 77.7 (L) 02/14/2022   PLT 318 02/14/2022    Assessment / Plan: Spontaneous labor, progressing normally AROM'd with MSAF  Labor: Progressing normally Preeclampsia:   142/73, labs ordered Fetal Wellbeing:  Category I Pain Control:  Epidural I/D:   Afebrile, GBS neg, AROM x 0 hours Anticipated MOD:  NSVD  Jenifer E Anala Whisenant, CNM 02/14/2022, 10:23 AM

## 2022-02-14 NOTE — OB Triage Note (Signed)
Margaretmary Eddy, CNM at nurses station and notified of patient's arrival. Verbal orders given to triage patient to r/o labor and SVE recheck in 1 hour. Will notify the patient on plan of care.

## 2022-02-14 NOTE — Progress Notes (Signed)
Labor Progress Note  Holly [name] is a 36 y.o. G3P2002 at [redacted]w[redacted]d by ultrasound admitted for active labor  Subjective: Pt denies pressure or feeling contractions  Objective: BP 137/75   Pulse 84   Temp 98.4 F (36.9 C)   Resp 19   Ht 5\' 6"  (1.676 m)   Wt (!) 136.5 kg   SpO2 100%   BMI 48.58 kg/m    Fetal Assessment: FHT:  FHR: 135 bpm, variability: moderate,  accelerations:  Present,  decelerations:  Present earlies Category/reactivity:  Category I UC:   regular, every 2-5 minutes SVE:    Dilation: 7cm  Effacement: 100%  Station:  -1  Consistency: soft  Position: anterior  Membrane status: AROM'd at 1016 Amniotic color: Moderate MSAF  Labs: Lab Results  Component Value Date   WBC 8.5 02/14/2022   HGB 12.6 02/14/2022   HCT 37.9 02/14/2022   MCV 77.7 (L) 02/14/2022   PLT 318 02/14/2022    Assessment / Plan: Spontaneous labor, slow cervical change, starting pitocin AROM'd with MSAF  Labor:  Augmented labor Preeclampsia:   labs pending Vitals:   02/14/22 0938 02/14/22 0951 02/14/22 0956 02/14/22 1000  BP: (!) 149/98 (!) 155/79 (!) 149/66 137/69   02/14/22 1001 02/14/22 1006 02/14/22 1011 02/14/22 1021  BP: 137/69 123/75 138/84 (!) 147/82   02/14/22 1055 02/14/22 1101 02/14/22 1149 02/14/22 1301  BP: 138/74 (!) 149/82 (!) 147/80 137/75    Fetal Wellbeing:  Category I Pain Control:  Epidural I/D:   Afebrile, GBS neg, AROM x 3 hours Anticipated MOD:  NSVD  Jenifer E Demaurion Dicioccio, CNM 02/14/2022, 1:11 PM

## 2022-02-14 NOTE — Discharge Summary (Signed)
Obstetrical Discharge Summary  Patient Name: Holly [name] DOB: 1985-09-23 MRN: [id number]  Date of Admission: 02/14/2022 Date of Delivery: 02/14/22 Delivered by: Annamary Rummage CNM Date of Discharge: 02/16/2022  Primary OB: [facility]  LMP:No LMP recorded. EDC Estimated Date of Delivery: 02/17/22 Gestational Age at Delivery: [redacted]w[redacted]d   Antepartum complications:  1. Elevated BPs started around 35 weeks 2. EFW 84% on 6/14  Admitting Diagnosis: Active labor  Secondary Diagnosis: Patient Active Problem List   Diagnosis Date Noted   NSVD (normal spontaneous vaginal delivery) 02/14/2022   Supervision of normal pregnancy in third trimester 01/21/2016    Augmentation: AROM and Pitocin Complications: None  Intrapartum complications/course:  Delivery Type: spontaneous vaginal delivery Anesthesia: epidural Placenta: spontaneous Laceration: none Episiotomy: none Newborn Data: Live born female  Birth Weight:  8#8 APGAR: 8, 9  Newborn Delivery   Birth date/time: 02/14/2022 19:38:00 Delivery type: Vaginal, Spontaneous      36 y.o. [id number] at [redacted]w[redacted]d presenting with active labor, AROM with meconium stained fluid.  She progressed to complete and pushed over an intact perineum and delivered the fetal head, followed promptly by the shoulders. She was in control the whole time, and the baby placed on the maternal abdomen. Delayed cord clamping and the patient cut the baby's cord, while the baby was skin to skin. The placenta delivered spontaneously and intact. No lacerations noted. Mom and baby tolerated the procedure well.   Postpartum Procedures: PP BTL done laparoscopy on 7/6  Post partum course:  Patient had an uncomplicated postpartum course.  By time of discharge on PPD#2, her pain was controlled on oral pain medications; she had appropriate lochia and was ambulating, voiding without difficulty and tolerating regular diet.  She was deemed stable for discharge to home.    Discharge Physical  Exam:  BP 111/67 (BP Location: Right Arm)   Pulse 88   Temp 97.8 F (36.6 C) (Oral)   Resp 20   Ht 5\' 6"  (1.676 m)   Wt (!) 136.5 kg   SpO2 99%   Breastfeeding Unknown   BMI 48.57 kg/m   General: alert and no distress Pulm: normal respiratory effort Lochia: appropriate Abdomen: soft, NT Uterine Fundus: firm, below umbilicus Periumbilical Incision: c/d/i, healing well, no significant drainage, no dehiscence, no significant erythema Extremities: No evidence of DVT seen on physical exam. No lower extremity edema. Edinburgh:     02/15/2022    6:31 AM  04/18/2022 Postnatal Depression Scale Screening Tool  I have been able to laugh and see the funny side of things. 1  I have looked forward with enjoyment to things. 0  I have blamed myself unnecessarily when things went wrong. 0  I have been anxious or worried for no good reason. 1  I have felt scared or panicky for no good reason. 0  Things have been getting on top of me. 1  I have been so unhappy that I have had difficulty sleeping. 0  I have felt sad or miserable. 0  I have been so unhappy that I have been crying. 0  The thought of harming myself has occurred to me. 0  Edinburgh Postnatal Depression Scale Total 3     Labs:    Latest Ref Rng & Units 02/15/2022    4:22 AM 02/14/2022    8:11 AM 08/12/2021    1:03 AM  CBC  WBC 4.0 - 10.5 K/uL 12.3  8.5  9.7   Hemoglobin 12.0 - 15.0 g/dL 08/14/2021  15.1  76.1  Hematocrit 36.0 - 46.0 % 37.8  37.9  40.2   Platelets 150 - 400 K/uL 295  318  310    O POS Hemoglobin  Date Value Ref Range Status  02/15/2022 12.6 12.0 - 15.0 g/dL Final   HCT  Date Value Ref Range Status  02/15/2022 37.8 36.0 - 46.0 % Final    Disposition: stable, discharge to home Baby Feeding: breastmilk and formula Baby Disposition: home with mom  Contraception: BTL, laparoscopic  bilateral cautery done 02/15/22  Prenatal Labs:  Blood type/Rh O pos  Antibody screen Neg  Rubella Immune  Varicella Immune   RPR NR  HBsAg NR  HIV NR  GC Neg  Chlamydia Neg  Genetic screening    1 hour GTT 136  3 hour GTT 85, 173, 113, 81  GBS Neg   Rh Immune globulin given: n/a Rubella vaccine given: Immune Varicella vaccine given: Immune Tdap vaccine given in AP or PP setting: unknown Flu vaccine given in AP or PP setting: not in season  Plan: Liridona Tabytha Gradillas was discharged to home in good condition. Follow-up appointment with delivering provider in 6 weeks.  Discharge Instructions: Per After Visit Summary. Activity: Advance as tolerated. Pelvic rest for 6 weeks.   Diet: Regular Discharge Medications: Allergies as of 02/16/2022   No Known Allergies      Medication List     TAKE these medications    acetaminophen 325 MG tablet Commonly known as: Tylenol Take 2 tablets (650 mg total) by mouth every 4 (four) hours as needed (for pain scale < 4).   benzocaine-Menthol 20-0.5 % Aero Commonly known as: DERMOPLAST Apply 1 Application topically as needed for irritation (perineal discomfort).   coconut oil Oil Apply 1 Application topically as needed.   diphenhydrAMINE 25 mg capsule Commonly known as: BENADRYL Take 1 capsule (25 mg total) by mouth every 6 (six) hours as needed for itching.   docusate sodium 100 MG capsule Commonly known as: COLACE Take 1 capsule (100 mg total) by mouth 2 (two) times daily.   ibuprofen 600 MG tablet Commonly known as: ADVIL Take 1 tablet (600 mg total) by mouth every 6 (six) hours.   NIFEdipine 30 MG 24 hr tablet Commonly known as: ADALAT CC Take 1 tablet (30 mg total) by mouth daily.   oxyCODONE 5 MG immediate release tablet Commonly known as: Oxy IR/ROXICODONE Take 1 tablet (5 mg total) by mouth every 6 (six) hours as needed for up to 7 days (pain scale 4-7).   simethicone 80 MG chewable tablet Commonly known as: MYLICON Chew 1 tablet (80 mg total) by mouth as needed for flatulence.   witch hazel-glycerin pad Commonly known as:  TUCKS Apply 1 Application topically as needed for hemorrhoids.       Outpatient follow up:   Follow-up Information     [facility]. Schedule an appointment as soon as possible for a visit in 6 week(s).   Contact information: [address] [place] [address]        [facility] Follow up.   Why: Call with any questions or concerns Contact information: [address] [place] [address] [phone]                Signed: Randa Ngo, CNM  02/16/2022 8:57 AM

## 2022-02-15 ENCOUNTER — Encounter
Admission: EM | Disposition: A | Discharge status: Home / Self Care | Payer: Self-pay | Source: Home / Self Care | Attending: Obstetrics

## 2022-02-15 ENCOUNTER — Inpatient Hospital Stay: Payer: BC Managed Care – PPO | Admitting: Anesthesiology

## 2022-02-15 ENCOUNTER — Encounter: Payer: Self-pay | Admitting: Obstetrics and Gynecology

## 2022-02-15 ENCOUNTER — Other Ambulatory Visit: Payer: Self-pay

## 2022-02-15 HISTORY — PX: TUBAL LIGATION: SHX77

## 2022-02-15 LAB — CBC
HCT: 37.8 % (ref 36.0–46.0)
Hemoglobin: 12.6 g/dL (ref 12.0–15.0)
MCH: 26.1 pg (ref 26.0–34.0)
MCHC: 33.3 g/dL (ref 30.0–36.0)
MCV: 78.4 fL — ABNORMAL LOW (ref 80.0–100.0)
Platelets: 295 10*3/uL (ref 150–400)
RBC: 4.82 MIL/uL (ref 3.87–5.11)
RDW: 14.2 % (ref 11.5–15.5)
WBC: 12.3 10*3/uL — ABNORMAL HIGH (ref 4.0–10.5)
nRBC: 0 % (ref 0.0–0.2)

## 2022-02-15 LAB — RPR: RPR Ser Ql: NONREACTIVE

## 2022-02-15 SURGERY — LIGATION, FALLOPIAN TUBE, POSTPARTUM
Anesthesia: General | Laterality: Bilateral

## 2022-02-15 MED ORDER — OXYCODONE HCL 5 MG PO TABS: 5.0000 mg | ORAL_TABLET | ORAL | Status: DC | PRN

## 2022-02-15 MED ORDER — NIFEDIPINE ER OSMOTIC RELEASE 30 MG PO TB24
30.0000 mg | ORAL_TABLET | Freq: Every day | ORAL | Status: DC
  Administered 2022-02-15 – 2022-02-16 (×2): 30 mg via ORAL
  Filled 2022-02-15 (×2): qty 1

## 2022-02-15 MED ORDER — ROCURONIUM BROMIDE 100 MG/10ML IV SOLN
INTRAVENOUS | Status: DC | PRN
  Administered 2022-02-15 (×2): 50 mg via INTRAVENOUS

## 2022-02-15 MED ORDER — METOCLOPRAMIDE HCL 10 MG PO TABS
ORAL_TABLET | ORAL | Status: AC
  Filled 2022-02-15: qty 1

## 2022-02-15 MED ORDER — FAMOTIDINE 20 MG PO TABS
40.0000 mg | ORAL_TABLET | Freq: Once | ORAL | Status: AC
  Administered 2022-02-15: 40 mg via ORAL
  Filled 2022-02-15: qty 2

## 2022-02-15 MED ORDER — METOCLOPRAMIDE HCL 10 MG PO TABS
10.0000 mg | ORAL_TABLET | Freq: Once | ORAL | Status: AC
  Administered 2022-02-15: 10 mg via ORAL
  Filled 2022-02-15: qty 1

## 2022-02-15 MED ORDER — LACTATED RINGERS IV SOLN: INTRAVENOUS | Status: DC

## 2022-02-15 MED ORDER — PHENYLEPHRINE HCL (PRESSORS) 10 MG/ML IV SOLN
INTRAVENOUS | Status: AC
  Filled 2022-02-15: qty 1

## 2022-02-15 MED ORDER — DEXAMETHASONE SODIUM PHOSPHATE 10 MG/ML IJ SOLN
INTRAMUSCULAR | Status: DC | PRN
  Administered 2022-02-15: 10 mg via INTRAVENOUS

## 2022-02-15 MED ORDER — FENTANYL CITRATE (PF) 100 MCG/2ML IJ SOLN
INTRAMUSCULAR | Status: DC | PRN
  Administered 2022-02-15 (×4): 50 ug via INTRAVENOUS

## 2022-02-15 MED ORDER — LIDOCAINE HCL (CARDIAC) PF 100 MG/5ML IV SOSY
PREFILLED_SYRINGE | INTRAVENOUS | Status: DC | PRN
  Administered 2022-02-15: 100 mg via INTRAVENOUS

## 2022-02-15 MED ORDER — PROPOFOL 10 MG/ML IV BOLUS
INTRAVENOUS | Status: AC
  Filled 2022-02-15: qty 20

## 2022-02-15 MED ORDER — BUPIVACAINE HCL 0.5 % IJ SOLN
INTRAMUSCULAR | Status: DC | PRN
  Administered 2022-02-15: 10 mL

## 2022-02-15 MED ORDER — FENTANYL CITRATE (PF) 100 MCG/2ML IJ SOLN
INTRAMUSCULAR | Status: AC
  Filled 2022-02-15: qty 2

## 2022-02-15 MED ORDER — SUGAMMADEX SODIUM 500 MG/5ML IV SOLN
INTRAVENOUS | Status: DC | PRN
  Administered 2022-02-15: 500 mg via INTRAVENOUS

## 2022-02-15 MED ORDER — PHENYLEPHRINE HCL (PRESSORS) 10 MG/ML IV SOLN
INTRAVENOUS | Status: DC | PRN
  Administered 2022-02-15: 80 ug via INTRAVENOUS

## 2022-02-15 MED ORDER — ONDANSETRON HCL 4 MG/2ML IJ SOLN
INTRAMUSCULAR | Status: DC | PRN
  Administered 2022-02-15: 4 mg via INTRAVENOUS

## 2022-02-15 MED ORDER — BUPIVACAINE HCL (PF) 0.5 % IJ SOLN
INTRAMUSCULAR | Status: AC
  Filled 2022-02-15: qty 30

## 2022-02-15 MED ORDER — PROPOFOL 10 MG/ML IV BOLUS
INTRAVENOUS | Status: DC | PRN
  Administered 2022-02-15: 160 mg via INTRAVENOUS

## 2022-02-15 MED ORDER — 0.9 % SODIUM CHLORIDE (POUR BTL) OPTIME
TOPICAL | Status: DC | PRN
  Administered 2022-02-15: 150 mL

## 2022-02-15 MED ORDER — FAMOTIDINE 20 MG PO TABS
ORAL_TABLET | ORAL | Status: AC
  Filled 2022-02-15: qty 2

## 2022-02-15 SURGICAL SUPPLY — 36 items
BACTOSHIELD CHG 4% 4OZ (MISCELLANEOUS) ×1
BLADE SURG SZ11 CARB STEEL (BLADE) ×2 IMPLANT
CHLORAPREP W/TINT 26 (MISCELLANEOUS) ×2 IMPLANT
DEFOGGER ANTIFOG KIT (MISCELLANEOUS) ×1 IMPLANT
DEFOGGER SCOPE WARMER CLEARIFY (MISCELLANEOUS) ×1 IMPLANT
DERMABOND ADVANCED (GAUZE/BANDAGES/DRESSINGS) ×1
DERMABOND ADVANCED .7 DNX12 (GAUZE/BANDAGES/DRESSINGS) ×1 IMPLANT
DRAPE LAPAROTOMY 77X122 PED (DRAPES) ×2 IMPLANT
DRSG TEGADERM 2-3/8X2-3/4 SM (GAUZE/BANDAGES/DRESSINGS) ×3 IMPLANT
ELECT CAUTERY BLADE 6.4 (BLADE) ×2 IMPLANT
ELECT REM PT RETURN 9FT ADLT (ELECTROSURGICAL) ×2
ELECTRODE REM PT RTRN 9FT ADLT (ELECTROSURGICAL) ×1 IMPLANT
GLOVE SURG SYN 8.0 (GLOVE) ×2 IMPLANT
GLOVE SURG SYN 8.0 PF PI (GLOVE) ×1 IMPLANT
GOWN STRL REUS W/ TWL LRG LVL3 (GOWN DISPOSABLE) ×1 IMPLANT
GOWN STRL REUS W/ TWL XL LVL3 (GOWN DISPOSABLE) ×1 IMPLANT
GOWN STRL REUS W/TWL LRG LVL3 (GOWN DISPOSABLE) ×1
GOWN STRL REUS W/TWL XL LVL3 (GOWN DISPOSABLE) ×1
KIT TURNOVER KIT A (KITS) ×2 IMPLANT
LABEL OR SOLS (LABEL) ×2 IMPLANT
MANIFOLD NEPTUNE II (INSTRUMENTS) ×2 IMPLANT
NEEDLE HYPO 22GX1.5 SAFETY (NEEDLE) ×2 IMPLANT
NS IRRIG 500ML POUR BTL (IV SOLUTION) ×2 IMPLANT
PACK BASIN MINOR ARMC (MISCELLANEOUS) ×2 IMPLANT
SCRUB CHG 4% DYNA-HEX 4OZ (MISCELLANEOUS) ×1 IMPLANT
SET TUBE SMOKE EVAC HIGH FLOW (TUBING) ×1 IMPLANT
SPONGE GAUZE 2X2 8PLY STRL LF (GAUZE/BANDAGES/DRESSINGS) ×2 IMPLANT
SUT PLAIN GUT 0 (SUTURE) ×4 IMPLANT
SUT VIC AB 2-0 UR6 27 (SUTURE) ×2 IMPLANT
SUT VIC AB 4-0 SH 27 (SUTURE) ×2
SUT VIC AB 4-0 SH 27XANBCTRL (SUTURE) ×1 IMPLANT
SUT VICRYL 0 AB UR-6 (SUTURE) ×2 IMPLANT
SYR 10ML LL (SYRINGE) ×2 IMPLANT
TROCAR XCEL BLUNT TIP 100MML (ENDOMECHANICALS) ×1 IMPLANT
TROCAR XCEL NON-BLD 5MMX100MML (ENDOMECHANICALS) ×1 IMPLANT
WATER STERILE IRR 500ML POUR (IV SOLUTION) ×2 IMPLANT

## 2022-02-15 NOTE — Brief Op Note (Signed)
02/15/2022  6:34 PM  PATIENT:  Holly [name]  36 y.o. female  PRE-OPERATIVE DIAGNOSIS:  desires sterilization  POST-OPERATIVE DIAGNOSIS:  desires sterilization  PROCEDURE:  postpartum tubal ligation converted to laparoscopic bilateral tubal cautery  SURGEON:  Surgeon(s) and Role:    * Valentina Alcoser, Ihor Austin, MD - Primary  PHYSICIAN ASSISTANT: CST  ASSISTANTS: none   ANESTHESIA:   general  EBL:  minimal   BLOOD ADMINISTERED:none  DRAINS: none   LOCAL MEDICATIONS USED:  MARCAINE     SPECIMEN:  No Specimen  DISPOSITION OF SPECIMEN:  N/A  COUNTS:  YES  TOURNIQUET:  * No tourniquets in log *  DICTATION: .Other Dictation: Dictation Number verbal  PLAN OF CARE: Admit to inpatient   PATIENT DISPOSITION:  PACU - hemodynamically stable.   Delay start of Pharmacological VTE agent (>24hrs) due to surgical blood loss or risk of bleeding: not applicable

## 2022-02-15 NOTE — Transfer of Care (Signed)
Immediate Anesthesia Transfer of Care Note  Patient: Holly [name]  Procedure(s) Performed: POST PARTUM TUBAL LIGATION, CONVERTED TO LAPAROSCOPIC BILATERAL TUBAL CARTILAGE LIGATION (Bilateral)  Patient Location: PACU  Anesthesia Type:General  Level of Consciousness: sedated  Airway & Oxygen Therapy: Patient Spontanous Breathing and Patient connected to face mask oxygen  Post-op Assessment: Report given to RN and Post -op Vital signs reviewed and stable  Post vital signs: Reviewed and stable  Last Vitals:  Vitals Value Taken Time  BP 148/93 02/15/22 1845  Temp 36.6 C 02/15/22 1846  Pulse 93 02/15/22 1848  Resp 21 02/15/22 1848  SpO2 99 % 02/15/22 1848  Vitals shown include unvalidated device data.  Last Pain:  Vitals:   02/15/22 1846  TempSrc:   PainSc: 0-No pain         Complications:  Encounter Notable Events  Notable Event Outcome Phase Comment  Difficult to intubate - unexpected  Intraprocedure Filed from anesthesia note documentation.

## 2022-02-15 NOTE — Progress Notes (Signed)
Patient ID: Holly [name], female   DOB: 05-21-1986, 36 y.o.   MRN: [id number] Pt elects for sterilization . She reconfirms today .internet translator present for review of theprocedure and risks of the procedure . No abd or pelvic surgery . No STI . Failure rate 1:300 .

## 2022-02-15 NOTE — Anesthesia Postprocedure Evaluation (Signed)
Anesthesia Post Note  Patient: [name]  Procedure(s) Performed: POST PARTUM TUBAL LIGATION, CONVERTED TO LAPAROSCOPIC BILATERAL TUBAL CARTILAGE LIGATION (Bilateral)  Patient location during evaluation: PACU Anesthesia Type: General Level of consciousness: awake and alert Pain management: pain level controlled Vital Signs Assessment: post-procedure vital signs reviewed and stable Respiratory status: spontaneous breathing, nonlabored ventilation, respiratory function stable and patient connected to nasal cannula oxygen Cardiovascular status: blood pressure returned to baseline and stable Postop Assessment: no apparent nausea or vomiting Anesthetic complications: yes   Encounter Notable Events  Notable Event Outcome Phase Comment  Difficult to intubate - unexpected  Intraprocedure Filed from anesthesia note documentation.     Last Vitals:  Vitals:   02/15/22 1900 02/15/22 1915  BP: (!) 147/96 (!) 155/89  Pulse: 80 89  Resp: (!) 21 (!) 21  Temp:  (!) 36.1 C  SpO2: 97% 98%    Last Pain:  Vitals:   02/15/22 1915  TempSrc:   PainSc: 0-No pain                 [name]

## 2022-02-15 NOTE — Lactation Note (Signed)
This note was copied from a baby's chart. Lactation Consultation Note  Patient Name: Holly [name] [id number] Date: 02/15/2022 Reason for consult: Initial assessment;Term Age:35 hours  Maternal Data This is mom's 3rd baby, born NSVD. Mom with no history of prenatal complications.She is an experienced breastfeeding mother. Mom reports she breastfed previous children but her plan for this baby is combination feeding both breastfeeding and formula supplementation. Her plan is different for this baby as she is planning to return to work and she feels a combination plan would work best. She does not have any pump at home and would like a manual pump.  Has patient been taught Hand Expression?: No Does the patient have breastfeeding experience prior to this delivery?: Yes How long did the patient breastfeed?: 2-2.5 years with her previous 2 children. Her last child is 6 years ols.  Feeding Mother's Current Feeding Choice: Breast Milk and Formula (Mom reports she wants to combination feed as she plans to return to work. She did not work with her previous 2 children.) Nipple Type: Slow - flow  Lactation Tools Discussed/Used Tools: Pump Breast pump type: Manual (Mom did not have a pump at home. She is returning to work. Provided manual pump.) Pump Education: Milk Storage;Setup, frequency, and cleaning  Interventions Interventions: Breast feeding basics reviewed;Education (Mom breastfed previous children for 2-2.5 years .Mom reports she knows how to breastfeed, doesnt need assistance at this time. Provided LC number on board if she does need any assistance.)  Discharge Discharge Education: Warning signs for feeding baby;Engorgement and breast care (Provided LC contact information for [facility] if mom has additional questions about returning to work while breastfeeding.) Pump: Manual (Mom can also inquire if she can obtain electric pump through her [employer].)  All dialogue with parents  with assistance of Spanish Interpreter(Amanda). Consult Status Consult Status: PRN  Update provided to care nurse.  Fuller Song 02/15/2022, 11:48 AM

## 2022-02-15 NOTE — Anesthesia Preprocedure Evaluation (Addendum)
Anesthesia Evaluation  Patient identified by MRN, date of birth, ID band Patient awake    Reviewed: Allergy & Precautions, NPO status , Patient's Chart, lab work & pertinent test results  History of Anesthesia Complications Negative for: history of anesthetic complications  Airway Mallampati: III  TM Distance: >3 FB Neck ROM: full    Dental  (+) Chipped, Loose   Pulmonary neg pulmonary ROS, neg shortness of breath,    Pulmonary exam normal        Cardiovascular (-) Past MI negative cardio ROS Normal cardiovascular exam     Neuro/Psych negative neurological ROS  negative psych ROS   GI/Hepatic negative GI ROS, Neg liver ROS, neg GERD  ,  Endo/Other  negative endocrine ROS  Renal/GU      Musculoskeletal   Abdominal   Peds  Hematology negative hematology ROS (+)   Anesthesia Other Findings History reviewed. No pertinent past medical history.  History reviewed. No pertinent surgical history.  BMI    Body Mass Index: 48.57 kg/m      Reproductive/Obstetrics negative OB ROS                            Anesthesia Physical Anesthesia Plan  ASA: 3  Anesthesia Plan: General ETT   Post-op Pain Management:    Induction: Intravenous  PONV Risk Score and Plan: Ondansetron, Dexamethasone, Midazolam and Treatment may vary due to age or medical condition  Airway Management Planned: Oral ETT  Additional Equipment:   Intra-op Plan:   Post-operative Plan: Extubation in OR  Informed Consent: I have reviewed the patients History and Physical, chart, labs and discussed the procedure including the risks, benefits and alternatives for the proposed anesthesia with the patient or authorized representative who has indicated his/her understanding and acceptance.     Dental Advisory Given and Interpreter used for interveiw  Plan Discussed with: Anesthesiologist, CRNA and Surgeon  Anesthesia  Plan Comments: (Patient consented for risks of anesthesia including but not limited to:  - adverse reactions to medications - damage to eyes, teeth, lips or other oral mucosa - nerve damage due to positioning  - sore throat or hoarseness - Damage to heart, brain, nerves, lungs, other parts of body or loss of life  Patient voiced understanding.)        Anesthesia Quick Evaluation

## 2022-02-15 NOTE — Anesthesia Procedure Notes (Signed)
Procedure Name: Intubation Date/Time: 02/15/2022 5:05 PM  Performed by: Nelda Marseille, CRNAPre-anesthesia Checklist: Patient identified, Patient being monitored, Timeout performed, Emergency Drugs available and Suction available Patient Re-evaluated:Patient Re-evaluated prior to induction Oxygen Delivery Method: Circle System Utilized Preoxygenation: Pre-oxygenation with 100% oxygen Induction Type: IV induction Ventilation: Mask ventilation without difficulty Laryngoscope Size: Mac, 3 and McGraph Grade View: Grade II Tube type: Oral Tube size: 7.0 mm Number of attempts: 1 Airway Equipment and Method: Stylet and Oral airway Placement Confirmation: ETT inserted through vocal cords under direct vision, positive ETCO2 and breath sounds checked- equal and bilateral Secured at: 21 cm Tube secured with: Tape Dental Injury: Teeth and Oropharynx as per pre-operative assessment  Difficulty Due To: Difficulty was unanticipated

## 2022-02-15 NOTE — Op Note (Signed)
NAME: Holly, [name] MEDICAL RECORD NO: [id number] ACCOUNT NO: [id number] DATE OF BIRTH: 11-May-1986 FACILITY: [facility] LOCATION: [facility] PHYSICIAN: Suzy Bouchard, MD  Operative Report   DATE OF PROCEDURE: 02/15/2022  PREOPERATIVE DIAGNOSIS:  Postpartum tubal ligation, elective sterilization.  POSTOPERATIVE DIAGNOSIS:  Postpartum tubal ligation, elective sterilization.  PROCEDURE:  Postpartum bilateral tubal ligation converted to laparoscopic bilateral tubal cautery.  SURGEON:  Suzy Bouchard, MD  FIRST ASSISTANT:  CST.  ANESTHESIA:  General endotracheal anesthesia.  INDICATIONS:  A 36 year old gravida 3, now para 3, postpartum day #1 who has decided on elective permanent sterilization.  The patient reconfirmed her desire and was counseled regarding the risk of the procedure with the Internet translator the day of  the procedure.  All questions were answered.  Consent signed.  DESCRIPTION OF PROCEDURE:  After adequate general endotracheal anesthesia, the patient was placed in dorsal supine position.  The patient's abdomen was prepped and draped in normal sterile fashion.  A 15 mm infraumbilical incision was made after  injection with 0.5% Marcaine. Fascia was identified and opened and the peritoneum was opened sharply.  Initial attempt to identify the fallopian tubes  after several different adjustments to the operating table and placing Raytec gauze into the abdominal  cavity to reduce the omentum,  ultimately the fallopian tubes could not be identified.  Most likely this is due to the patient's girth with her BMI of 48.  At this point, surgeon opted to convert to a laparoscopic procedure with the Hasson cannula  placed into the infraumbilical incision and anchored down to the skin next to the previously made incision.  The patient's abdomen was insufflated and she was placed in Trendelenburg.  The left lower port placement 3 cm medial to the left anterior iliac   spine, a 5 mm trocar was advanced under direct visualization and a third port was placed in the right lower quadrant 3 cm medial to the right anterior iliac spine.  The Kleppinger cautery was brought up to the operative field and the left fallopian tube  was identified and 4 separate areas were cauterized.  Similar procedure was repeated on the patient's right fallopian tube with cauterization of 4 separate areas on the fallopian tube.  Fimbriated end was visualized bilaterally.  There were no  complications.  The patient's abdomen was deflated and all trocars were removed.  The infraumbilical fascia was then closed with a running 2-0 Vicryl suture and all skin incisions were closed with interrupted 4-0 Vicryl suture.  Sterile dressing applied.   The patient tolerated the procedure well.  INTRAOPERATIVE FLUIDS:  800 mL.  BLOOD LOSS:  Minimal.  DISPOSITION:  The patient was taken to recovery room in good condition.   SHW D: 02/15/2022 7:12:45 pm T: 02/15/2022 10:36:00 pm  JOB: [id number]/ [id number]

## 2022-02-15 NOTE — Progress Notes (Addendum)
Postop Day  1  Subjective: no complaints, up ad lib, voiding, tolerating PO, and + flatus  Doing well, no concerns. Ambulating without difficulty, pain managed with PO meds, tolerating regular diet, and voiding without difficulty.   No fever/chills, chest pain, shortness of breath, nausea/vomiting, or leg pain. No nipple or breast pain. No headache, visual changes, or RUQ/epigastric pain.  Objective: BP (!) 119/92 (BP Location: Right Arm)   Pulse 83   Temp 98 F (36.7 C)   Resp 20   Ht 5\' 6"  (1.676 m)   Wt (!) 136.5 kg   SpO2 100%   Breastfeeding Unknown   BMI 48.58 kg/m    Physical Exam:  General: alert, cooperative, and appears stated age Breasts: soft/nontender CV: RRR Pulm: nl effort, CTABL Abdomen: soft, non-tender, active bowel sounds Uterine Fundus: firm Perineum: minimal edema, intact Lochia: appropriate DVT Evaluation: No evidence of DVT seen on physical exam. Negative Homan's sign. No cords or calf tenderness. No significant calf/ankle edema.  Recent Labs    02/14/22 0811 02/15/22 0422  HGB 12.6 12.6  HCT 37.9 37.8  WBC 8.5 12.3*  PLT 318 295    Assessment/Plan: 36 y.o. G3P3003 postpartum day # 1 -Dr 20 to consent for BTL -NPO   -Continue routine postpartum care -Lactation consult PRN for breastfeeding  -Discussed contraceptive options including implant, IUDs hormonal and non-hormonal, injection, pills/ring/patch, condoms, and NFP.  -Acute blood loss anemia - hemodynamically stable and asymptomatic; start PO ferrous sulfate BID with stool softeners  -Immunization status:   all immunizations up to date   Disposition: Continue inpatient postpartum care    LOS: 1 day     ----- Feliberto Gottron Certified Nurse Midwife [facility]

## 2022-02-16 ENCOUNTER — Other Ambulatory Visit: Payer: Self-pay

## 2022-02-16 ENCOUNTER — Encounter: Payer: Self-pay | Admitting: Obstetrics and Gynecology

## 2022-02-16 LAB — SURGICAL PATHOLOGY

## 2022-02-16 MED ORDER — ACETAMINOPHEN 325 MG PO TABS
650.0000 mg | ORAL_TABLET | ORAL | 0 refills | Status: AC | PRN
  Filled 2022-02-16: qty 30, 3d supply, fill #0

## 2022-02-16 MED ORDER — OXYCODONE HCL 5 MG PO TABS
5.0000 mg | ORAL_TABLET | Freq: Four times a day (QID) | ORAL | 0 refills | Status: AC | PRN
  Filled 2022-02-16: qty 28, 7d supply, fill #0

## 2022-02-16 MED ORDER — DIPHENHYDRAMINE HCL 25 MG PO CAPS: 25.0000 mg | ORAL_CAPSULE | Freq: Four times a day (QID) | ORAL | 0 refills | Status: AC | PRN

## 2022-02-16 MED ORDER — NIFEDIPINE ER 30 MG PO TB24
30.0000 mg | ORAL_TABLET | Freq: Every day | ORAL | 0 refills | Status: AC
  Filled 2022-02-16: qty 30, 30d supply, fill #0

## 2022-02-16 MED ORDER — DOCUSATE SODIUM 100 MG PO CAPS
100.0000 mg | ORAL_CAPSULE | Freq: Two times a day (BID) | ORAL | 0 refills | Status: AC
  Filled 2022-02-16: qty 30, 15d supply, fill #0

## 2022-02-16 MED ORDER — BENZOCAINE-MENTHOL 20-0.5 % EX AERO: 1.0000 | INHALATION_SPRAY | CUTANEOUS | Status: AC | PRN

## 2022-02-16 MED ORDER — SIMETHICONE 80 MG PO CHEW
80.0000 mg | CHEWABLE_TABLET | ORAL | 0 refills | Status: AC | PRN
  Filled 2022-02-16: qty 30, fill #0

## 2022-02-16 MED ORDER — COCONUT OIL OIL: 1.0000 | TOPICAL_OIL | 0 refills | Status: AC | PRN

## 2022-02-16 MED ORDER — WITCH HAZEL-GLYCERIN EX PADS: 1.0000 | MEDICATED_PAD | CUTANEOUS | 12 refills | Status: AC | PRN

## 2022-02-16 MED ORDER — IBUPROFEN 600 MG PO TABS
600.0000 mg | ORAL_TABLET | Freq: Four times a day (QID) | ORAL | 0 refills | Status: AC
  Filled 2022-02-16: qty 30, 8d supply, fill #0

## 2022-02-16 NOTE — Progress Notes (Signed)
Mother discharged. Discharge instructions given. Mother verbalizes understanding. Transported by axillary.  

## 2022-02-16 NOTE — Lactation Note (Signed)
This note was copied from a baby's chart. Lactation Consultation Note  Patient Name: Holly [name] [id number] Date: 02/16/2022 Reason for consult: Follow-up assessment Age:34 hours  Maternal Data  See initial consult note 02/15/22  Today mother reports "all is going well." She is breast and bottle feeding per her choice.   Feeding Mother's Current Feeding Choice: Breast Milk and Formula  Discharge Discharge Education: Other (comment) (Mother has no additional breastfeeding questions at this time. She reports all is " going well." She has LC contact information.) Pump: Personal (Provided mom information on contacting her insurance company to obtain a personal electric pump for home use.)  Consult Status Consult Status: Complete  All discussed with assistance of Spanish interpreter [name]).  Update provided to care nurse.  Fuller Song 02/16/2022, 11:03 AM

## 2022-02-16 NOTE — Anesthesia Postprocedure Evaluation (Signed)
Anesthesia Post Note  Patient: Holly [name]  Procedure(s) Performed: AN AD HOC LABOR EPIDURAL  Patient location during evaluation: Mother Baby Anesthesia Type: Epidural Level of consciousness: awake and alert Pain management: pain level controlled Vital Signs Assessment: post-procedure vital signs reviewed and stable Respiratory status: spontaneous breathing, nonlabored ventilation and respiratory function stable Cardiovascular status: stable Postop Assessment: no headache, no backache and epidural receding Anesthetic complications: no   No notable events documented.   Last Vitals:  Vitals:   02/15/22 2315 02/16/22 0315  BP: 131/81 133/78  Pulse: (!) 102 86  Resp: 18 18  Temp: 36.6 C 36.8 C  SpO2: 95% 99%    Last Pain:  Vitals:   02/16/22 0730  TempSrc:   PainSc: 3                  [name] B [name]

## 2023-04-30 IMAGING — US US OB < 14 WEEKS - US OB TV
1 series · 14 of 28 positions shown · non-contrast
Comparison: None.

CLINICAL DATA: Antenatal screening/dating.

EXAM:
OBSTETRIC <14 WK US AND TRANSVAGINAL OB US
TECHNIQUE: Both transabdominal and transvaginal ultrasound examinations were
performed for complete evaluation of the gestation as well as the
maternal uterus, adnexal regions, and pelvic cul-de-sac.
Transvaginal technique was performed to assess early pregnancy.

[Series 1: us ob less than 14 weeks with ob transvaginal · 133 acquisitions, 14 frames shown]
[im 5/133]
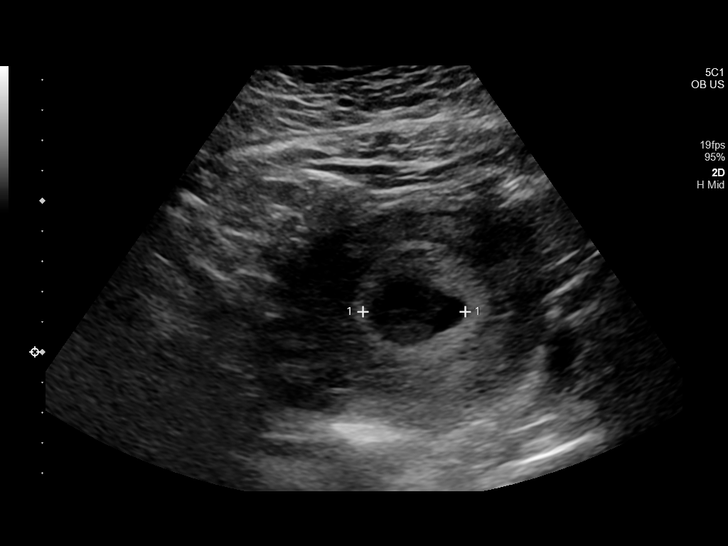
[im 15/133]
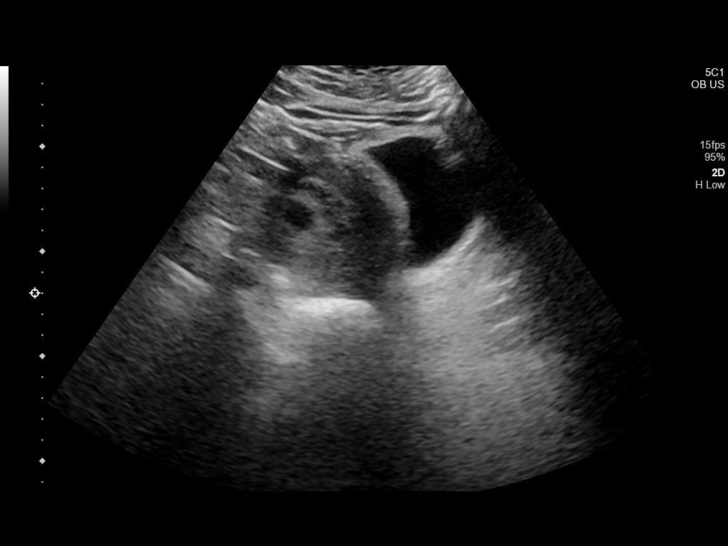
[im 25/133]
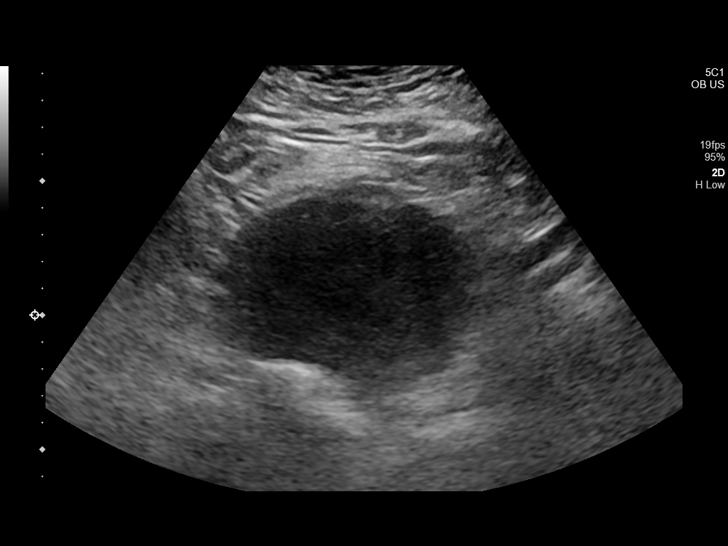
[im 35/133]
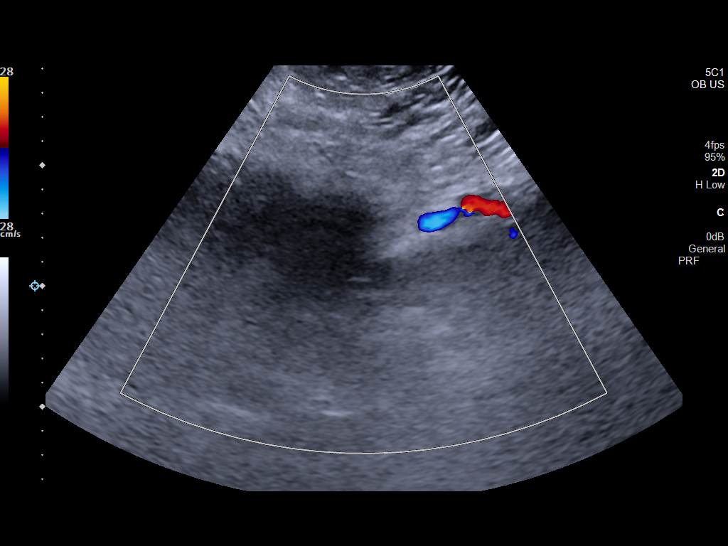
[im 45/133]
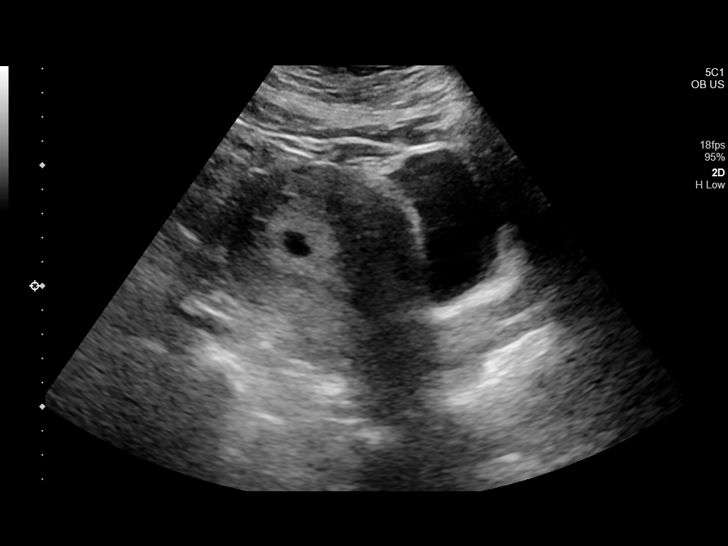
[im 54/133]
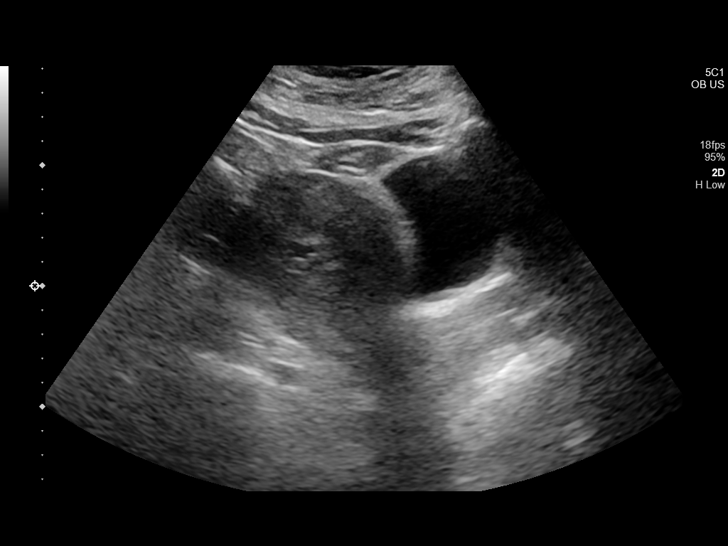
[im 64/133]
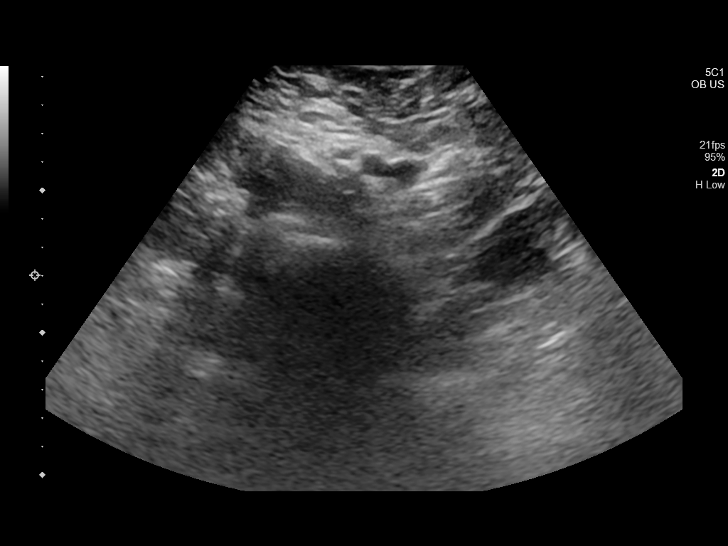
[im 74/133]
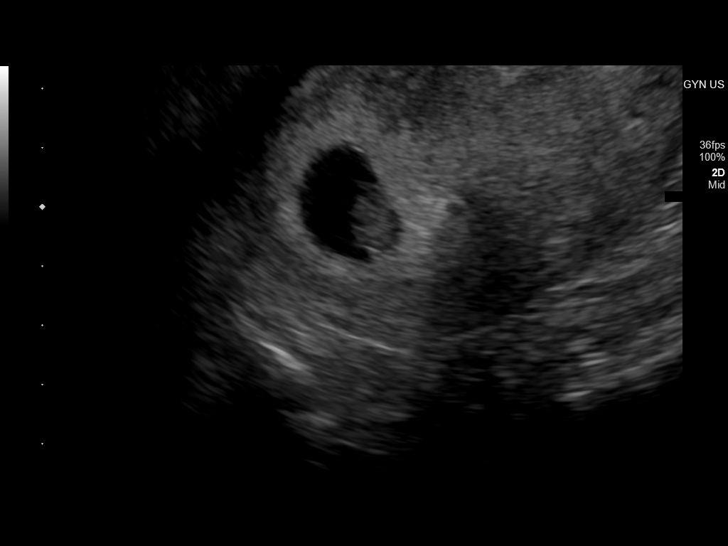
[im 84/133]
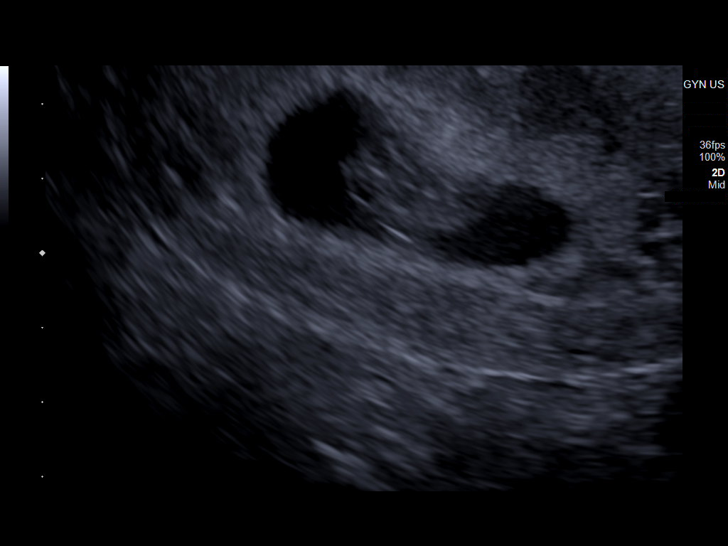
[im 93/133]
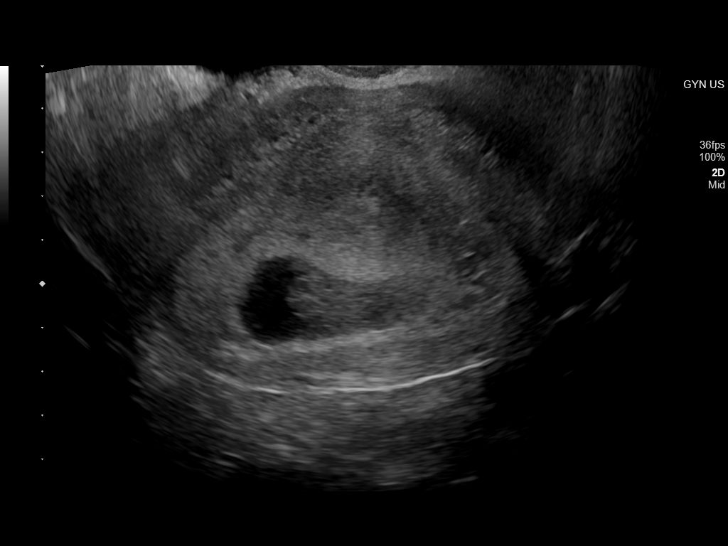
[im 103/133]
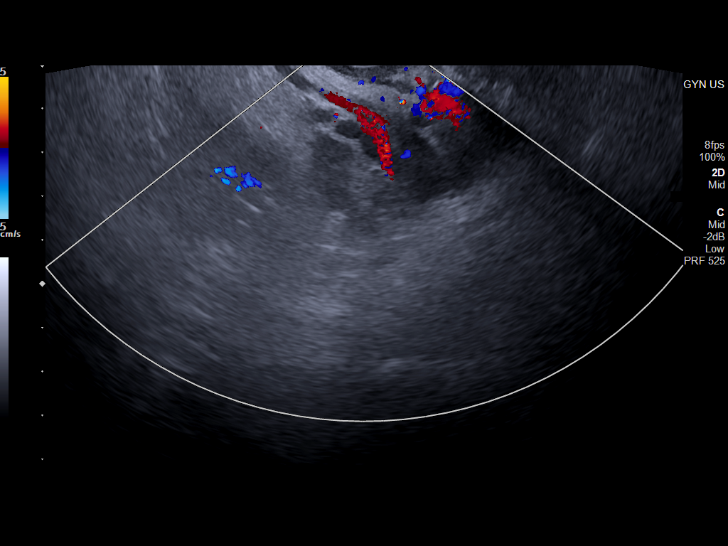
[im 113/133]
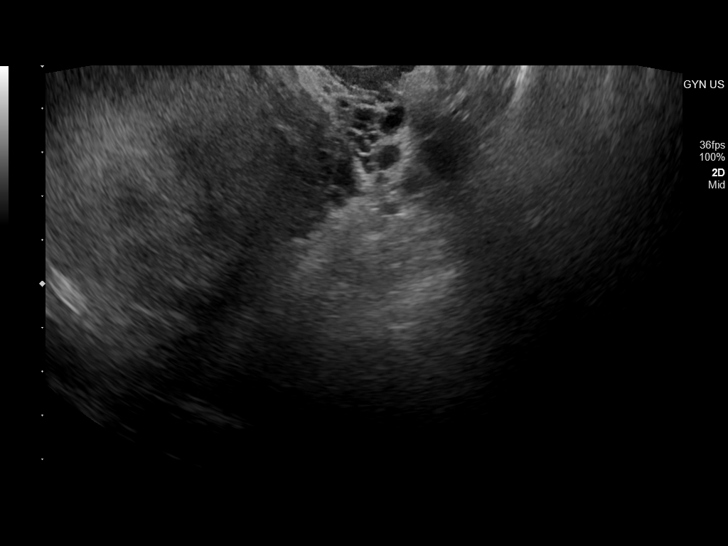
[im 123/133]
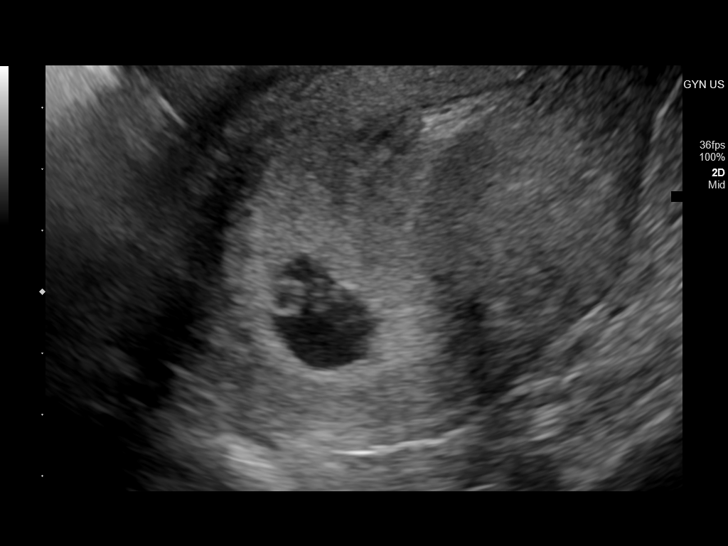
[im 133/133]
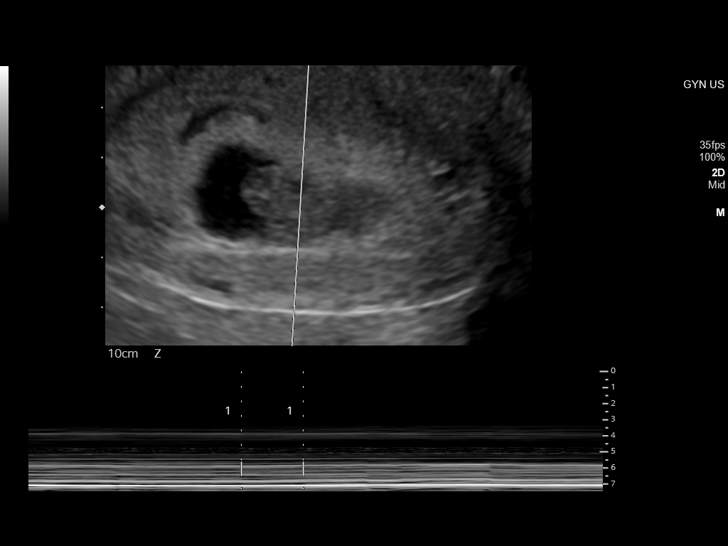

[14 of 28 positions shown; findings below may reference images not displayed]

FINDINGS: Intrauterine gestational sac: Single

Yolk sac:  Visualized.

Embryo:  Visualized.

Cardiac Activity: Visualized.

Heart Rate: 166 bpm

CRL:  19.6 mm   8 w   4 d                  US EDC: February 17, 2022

Subchorionic hemorrhage:  None visualized.

Maternal uterus/adnexae: The bilateral ovaries are not visualized.

No pelvic free fluid is seen.
IMPRESSION: Single, viable intrauterine pregnancy at approximately 8 weeks and 4
days gestation by ultrasound evaluation.

## 2023-05-31 IMAGING — US US OB COMP LESS 14 WK
1 series · 14 of 28 positions shown · non-contrast
Comparison: 07/12/2021

CLINICAL DATA: Pregnant, vaginal bleeding.

EXAM:
OBSTETRIC <14 WK ULTRASOUND
TECHNIQUE: Transabdominal ultrasound was performed for evaluation of the
gestation as well as the maternal uterus and adnexal regions.

[Series 1: us ob transvaginal · 29 acquisitions, 14 frames shown]
[im 2/29]
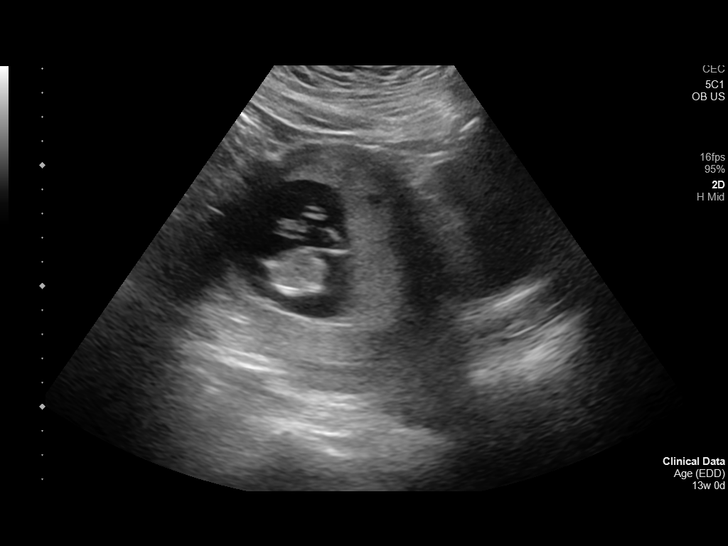
[im 4/29]
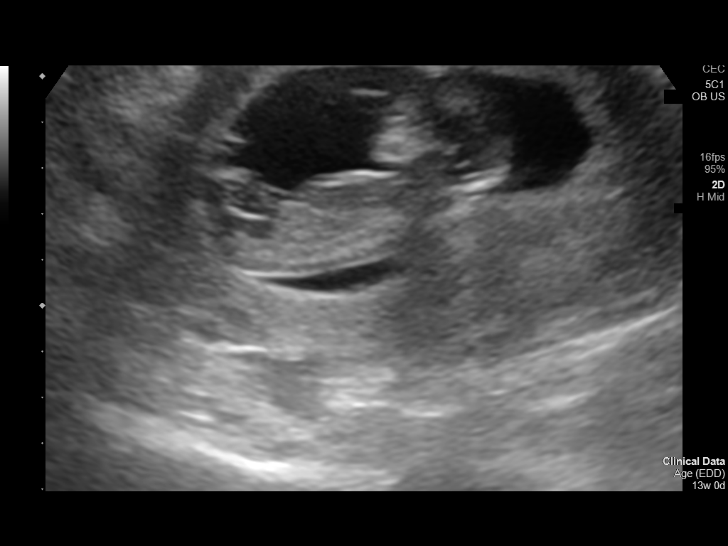
[im 6/29]
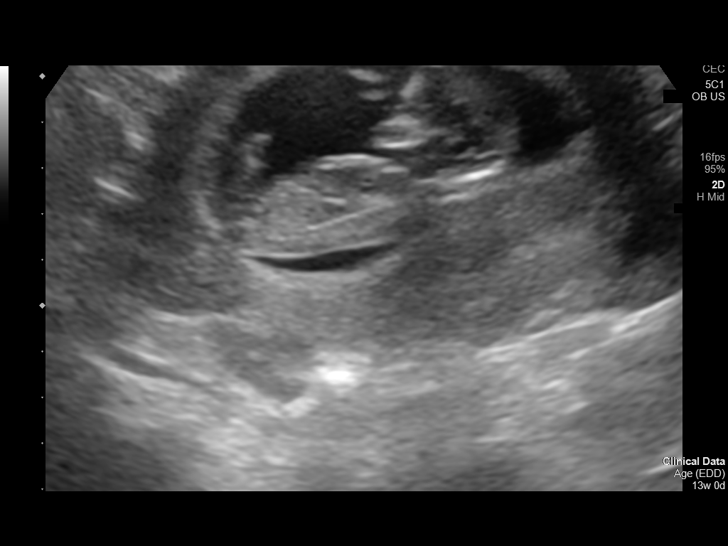
[im 8/29]
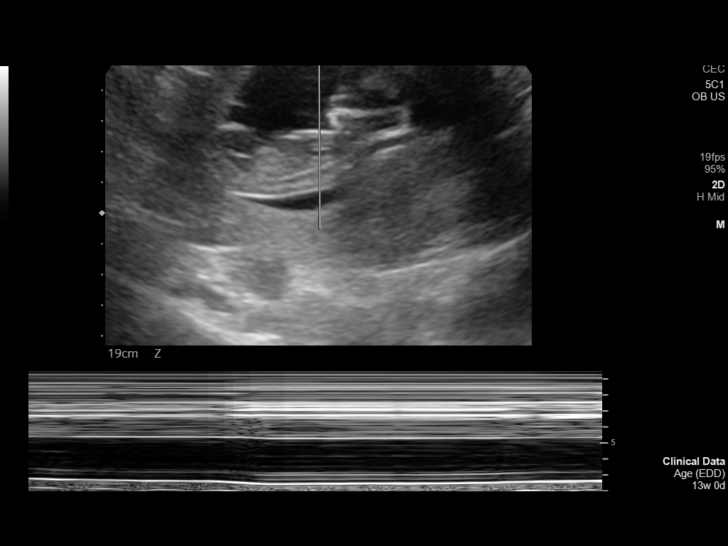
[im 10/29]
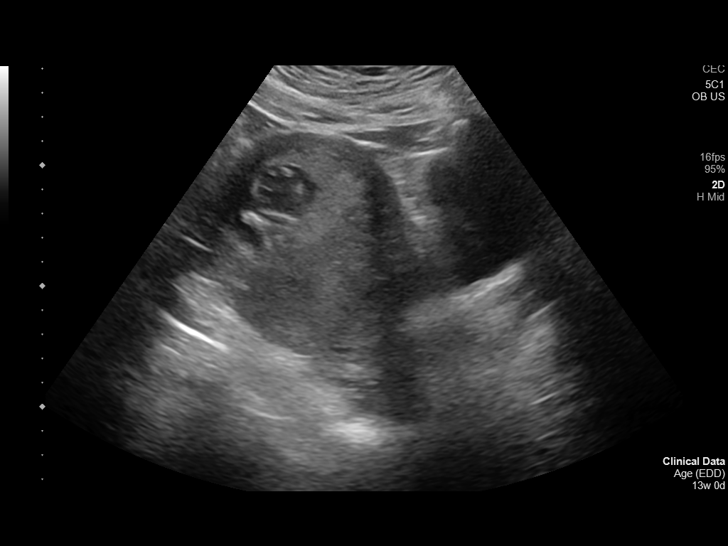
[im 12/29]
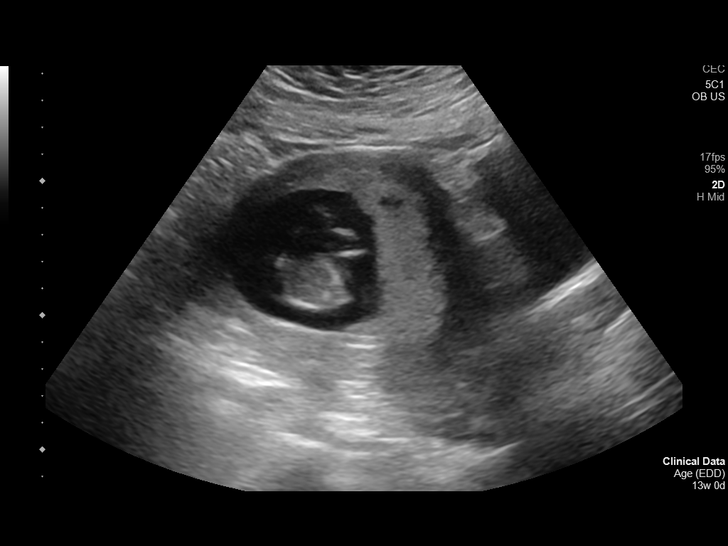
[im 14/29]
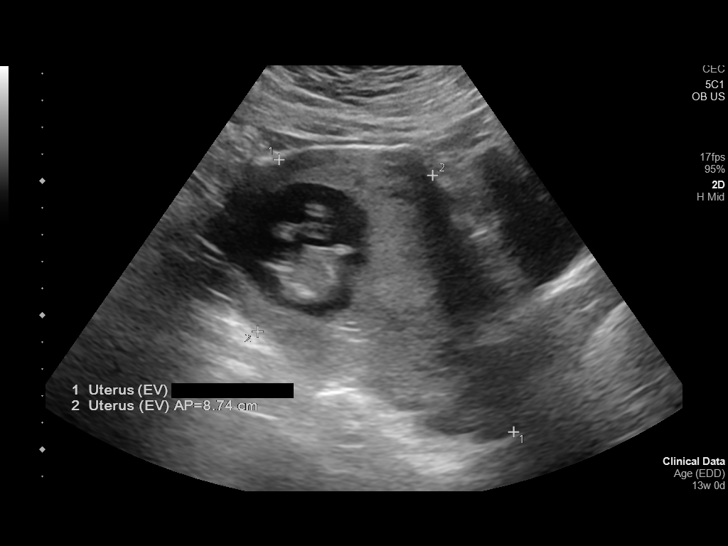
[im 16/29]
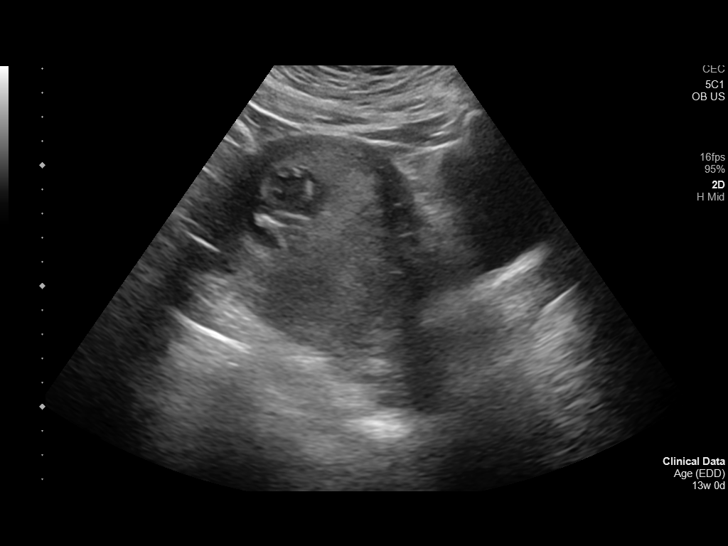
[im 18/29]
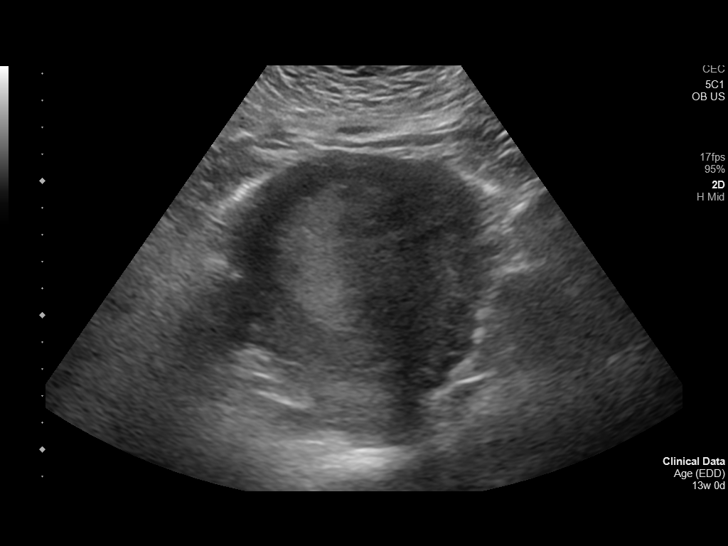
[im 20/29]
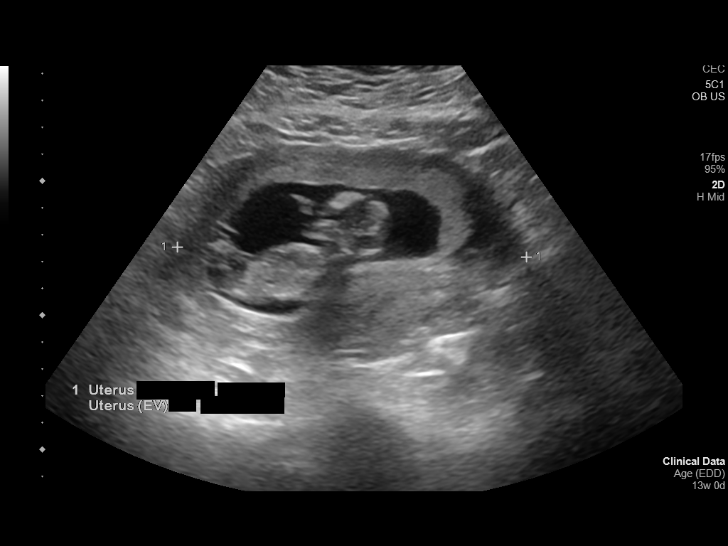
[im 22/29]
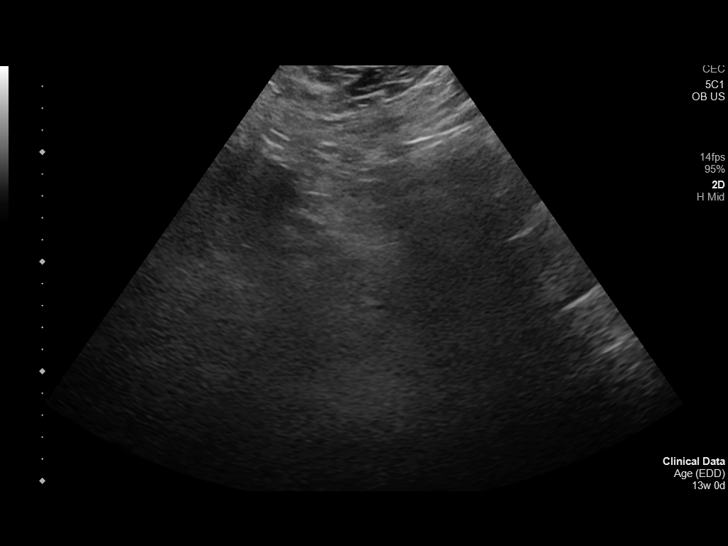
[im 24/29]
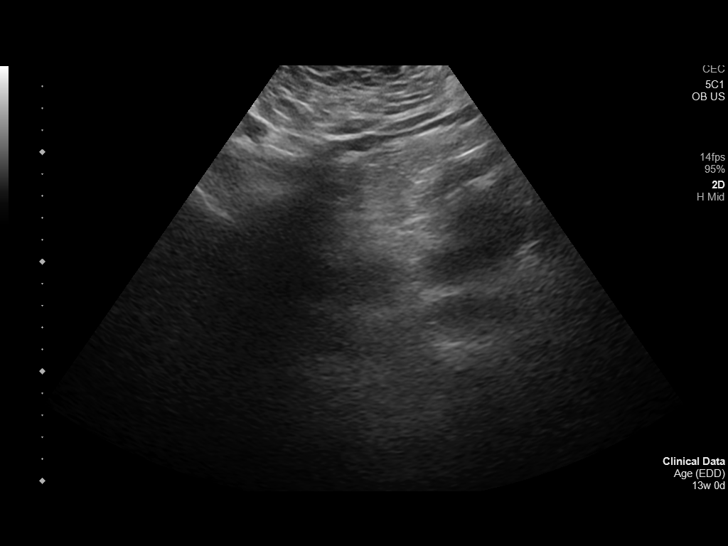
[im 26/29]
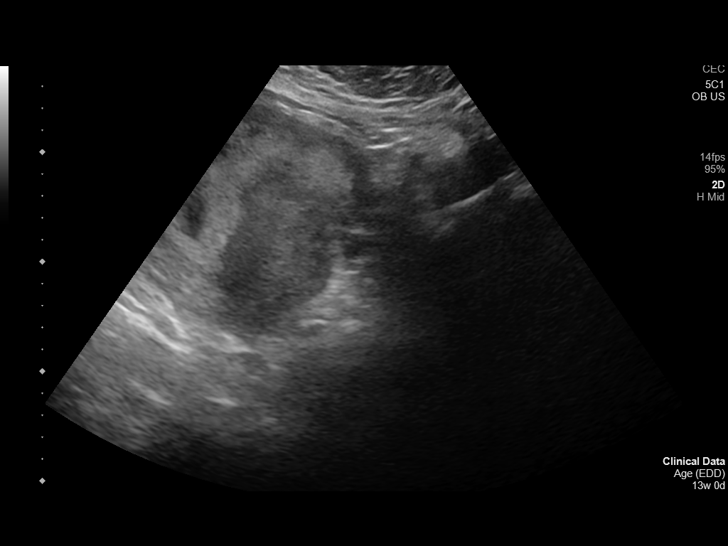
[im 29/29]
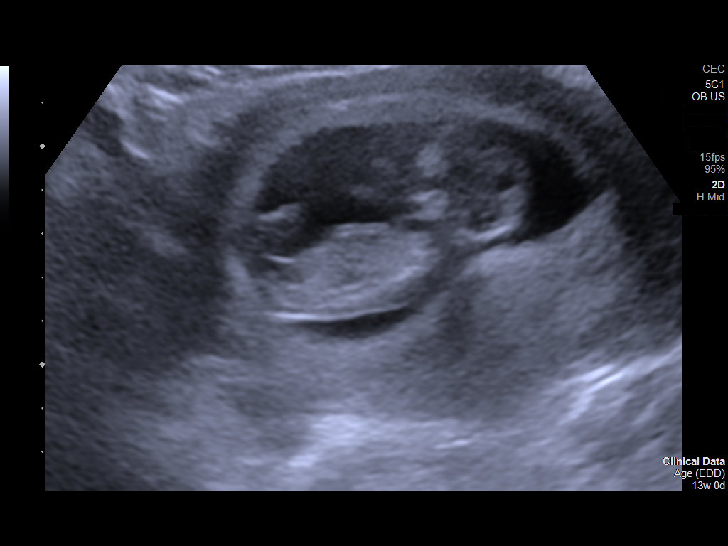

[14 of 28 positions shown; findings below may reference images not displayed]

FINDINGS: Intrauterine gestational sac: Present, single

Yolk sac:  Not visualized

Embryo:  Present, single

Cardiac Activity: Present, regular

Heart Rate: 166 bpm

MSD: Appropriate given fetal size

CRL:   7.0 mm   13 w 2 d

US EDC: 02/17/2022 (based on prior examination)

Subchorionic hemorrhage:  None visualized.

Maternal uterus/adnexae: The uterus is anteverted. No intrauterine
masses are seen. The cervix is not optimally visualized but is
unremarkable. No free intraperitoneal fluid. The maternal ovaries
are not visualized on this examination.
IMPRESSION: Single living intrauterine gestation with appropriate interval
growth since prior examination. No acute abnormality.

## 2023-07-02 ENCOUNTER — Ambulatory Visit: Payer: BLUE CROSS/BLUE SHIELD

## 2023-07-02 ENCOUNTER — Ambulatory Visit: Payer: Self-pay

## 2023-07-02 DIAGNOSIS — Z23 Encounter for immunization: Secondary | ICD-10-CM

## 2023-07-02 DIAGNOSIS — Z719 Counseling, unspecified: Secondary | ICD-10-CM

## 2023-07-02 NOTE — Progress Notes (Signed)
Patient seen in nurse clinic for immigration vaccines.  Patient had documentation of vaccines needed. Hep B, MMR, Varicella, Flu, Tdap and Polio given.  Tolerated well. VIS provided. Osborn Coho - interpreter. [facility] updated and 2 copies provided. Patient had 2 [facility]s - forwarded to be merged. Discussed return dates for vaccine shots.
